# Patient Record
Sex: Female | Born: 1985 | Race: Black or African American | Hispanic: No | Marital: Single | Smoking: Current some day smoker
Health system: Southern US, Community
[De-identification: ages and names within clinical notes are randomized; demographics above are authoritative.]

## PROBLEM LIST (undated history)

## (undated) DIAGNOSIS — D649 Anemia, unspecified: Secondary | ICD-10-CM

## (undated) HISTORY — DX: Anemia, unspecified: D64.9

## (undated) HISTORY — PX: WISDOM TOOTH EXTRACTION: SHX21

---

## 2009-06-13 ENCOUNTER — Ambulatory Visit: Payer: Self-pay | Admitting: Certified Nurse Midwife

## 2009-10-13 ENCOUNTER — Inpatient Hospital Stay: Payer: Self-pay | Admitting: Obstetrics and Gynecology

## 2010-10-20 IMAGING — US US OB US >=[ID] SNGL FETUS
1 series · 17 of 28 positions shown · non-contrast
Comparison: none

REASON FOR EXAM: anatomy dates placenta location
COMMENTS:

[Series 1: us ob us >=(id) sngl fetus · 17 of 57 slices shown]
[im 1/57]
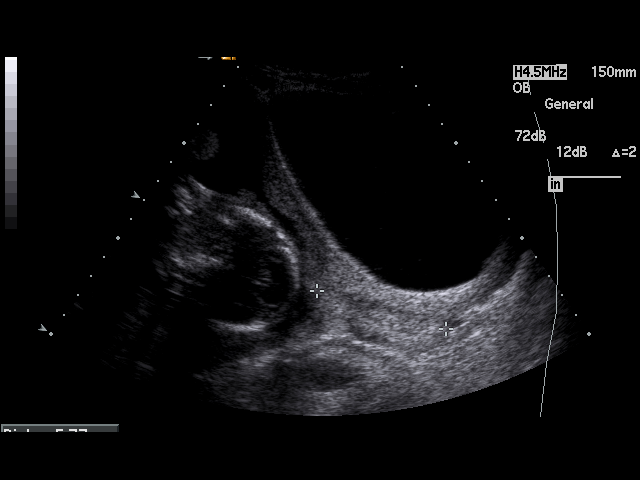
[im 5/57]
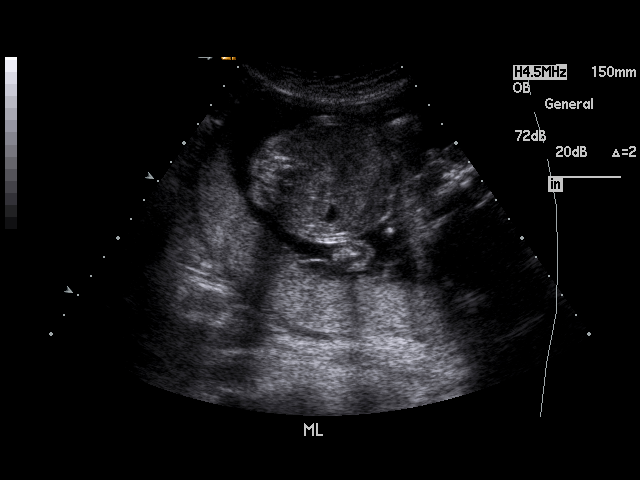
[im 9/57]
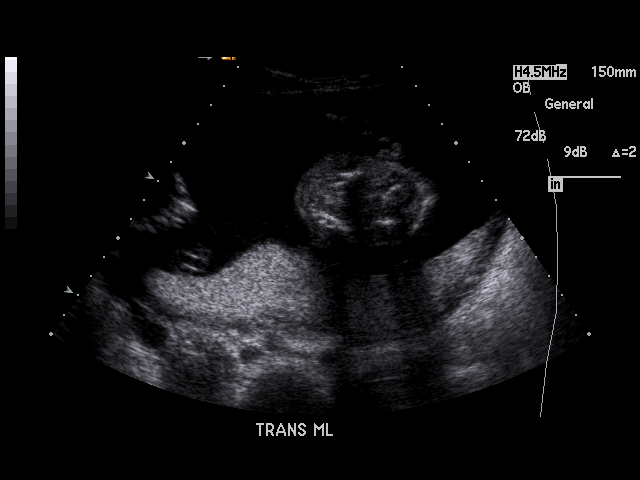
[im 11/57]
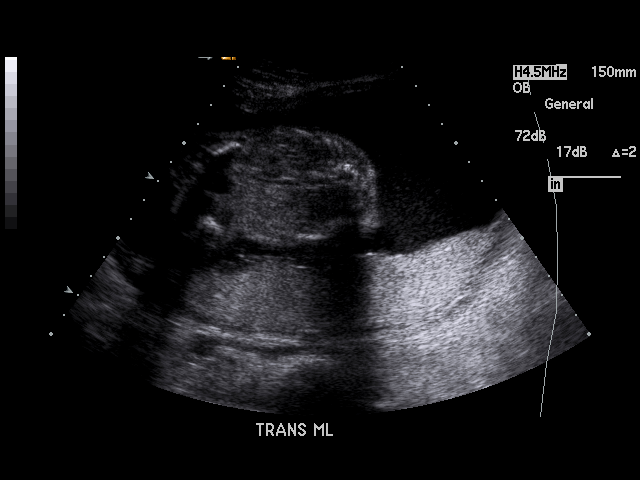
[im 15/57]
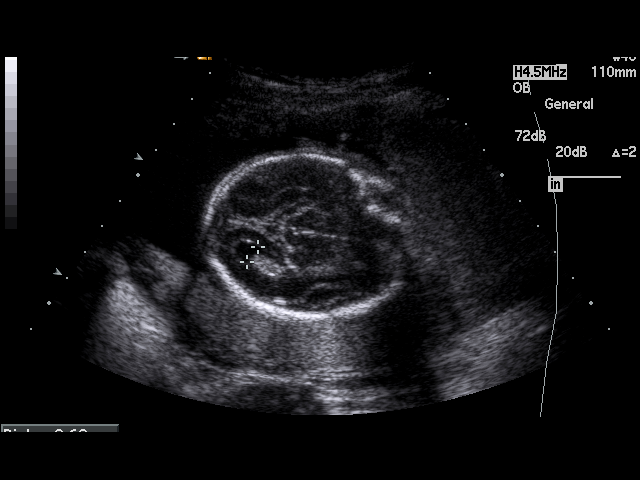
[im 19/57]
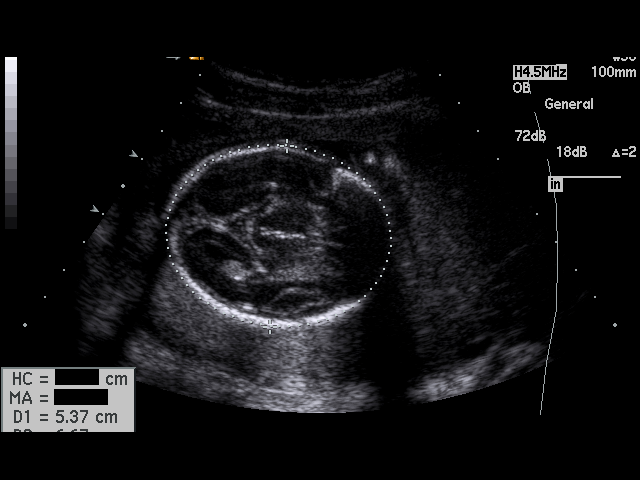
[im 21/57]
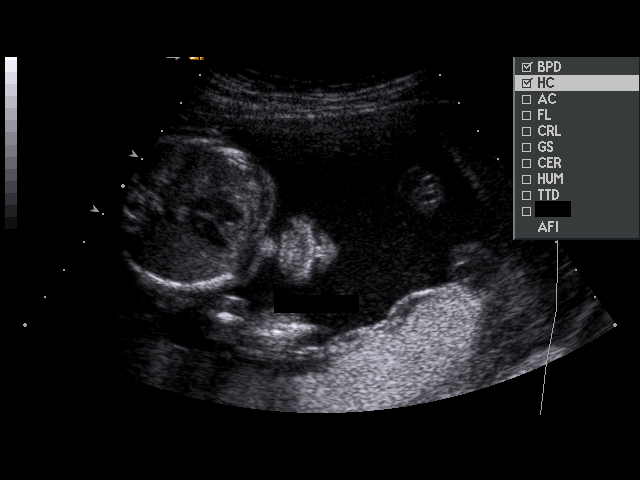
[im 25/57]
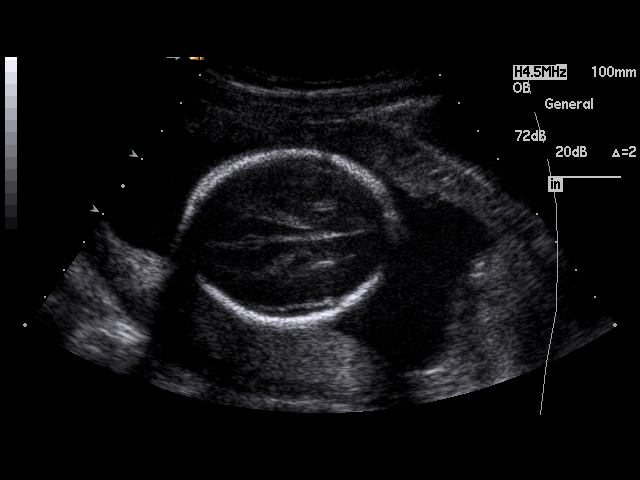
[im 30/57]
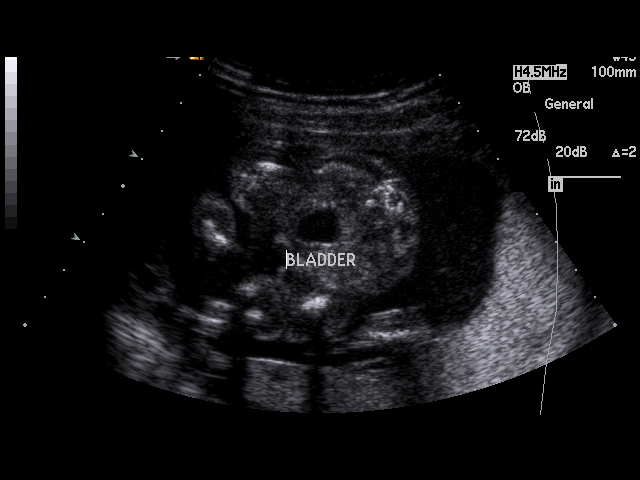
[im 32/57]
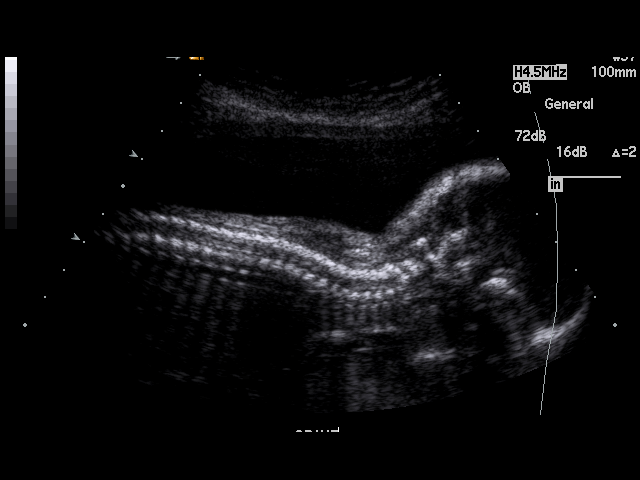
[im 36/57]
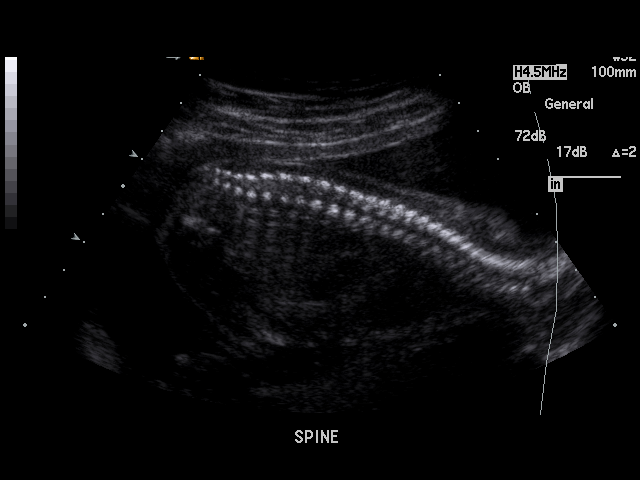
[im 38/57]
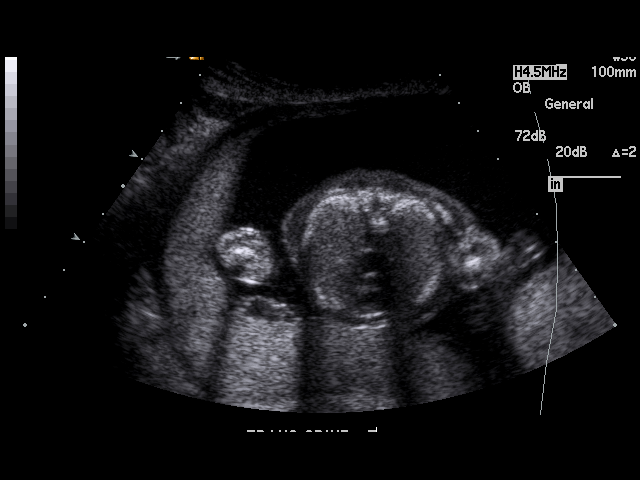
[im 42/57]
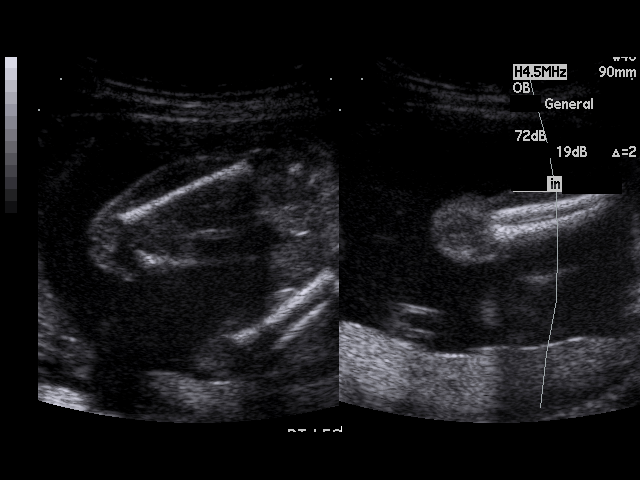
[im 46/57]
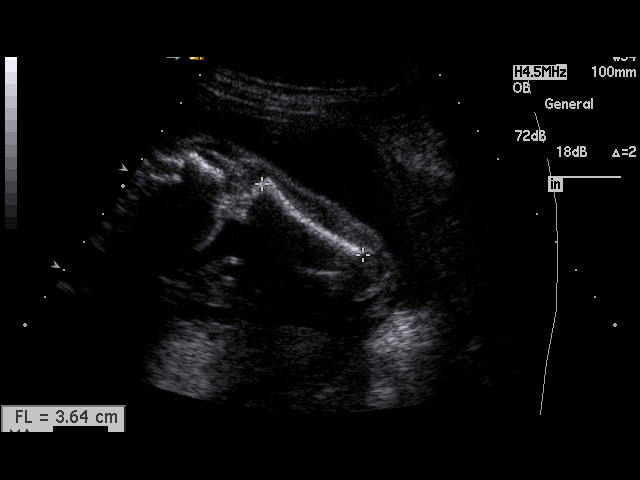
[im 48/57]
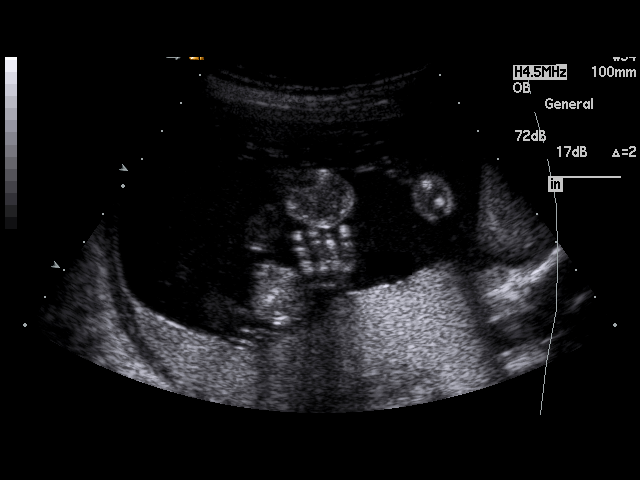
[im 52/57]
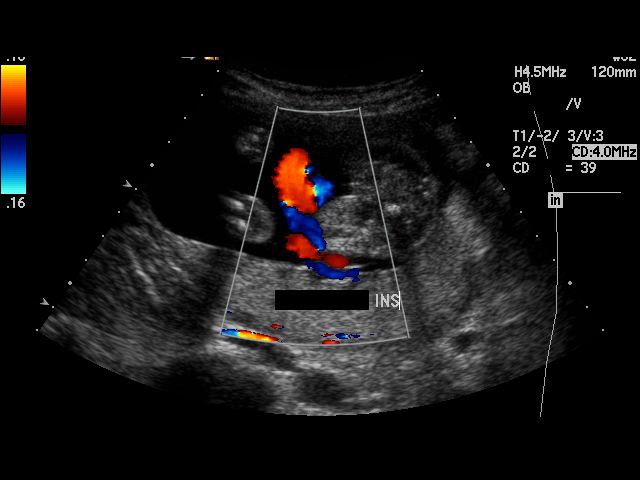
[im 57/57]
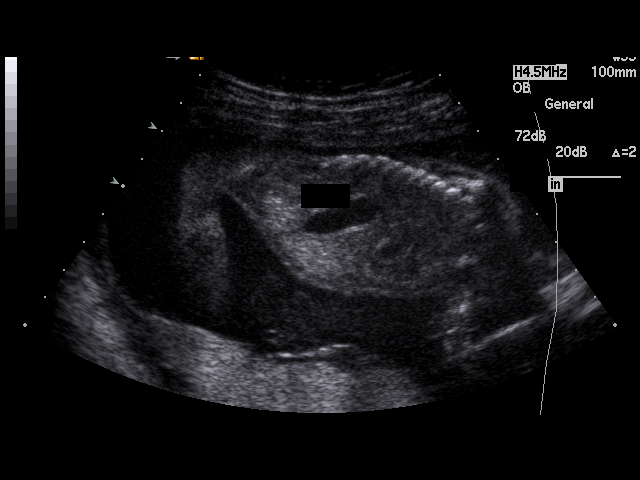

[17 of 28 positions shown; findings below may reference images not displayed]

PROCEDURE:     US  - US OB GREATER/OR EQUAL TO ODIAF  - June 13, 2009  [DATE]

RESULT:     There is observed a single, living intrauterine gestation.
Presentation currently is cephalic. Fetal heart rate was monitored at 141
beats per minute. Amniotic fluid volume appears normal. The placenta is
posterior and terminates approximately 3 cm above the cervix. The cervix
length measures 5.77 cm. The fetal heart, stomach and urinary bladder are
visualized. No hydrocephalus or hydronephrosis is seen. No fetal
abnormalities are noted.

Fetal measurements are as follows:

          BPD is 5.04 cm, corresponding to 21 weeks, 2 days.
            HC is 19.06 cm, corresponding to 21 weeks, 2 days.
             AC is 16.93 cm, corresponding to 21 weeks, 6 days.
              FL is 3.62 cm, corresponding to 21 weeks, 4 days.

EFW is 467 grams + / - 63 grams. Average ultrasound age is 21 weeks, 4 days.
Ultrasound EDD is October 20, 2009.
IMPRESSION: Please see above.

## 2013-11-15 ENCOUNTER — Emergency Department: Payer: Self-pay | Admitting: Emergency Medicine

## 2014-03-25 ENCOUNTER — Observation Stay: Payer: Self-pay

## 2014-03-25 LAB — URINALYSIS, COMPLETE
BACTERIA: NONE SEEN
Bilirubin,UR: NEGATIVE
Blood: NEGATIVE
Glucose,UR: NEGATIVE mg/dL (ref 0–75)
LEUKOCYTE ESTERASE: NEGATIVE
NITRITE: NEGATIVE
PROTEIN: NEGATIVE
Ph: 5 (ref 4.5–8.0)
RBC,UR: 3 /HPF (ref 0–5)
Specific Gravity: 1.028 (ref 1.003–1.030)
Squamous Epithelial: 10
WBC UR: 2 /HPF (ref 0–5)

## 2014-05-09 ENCOUNTER — Observation Stay: Payer: Self-pay | Admitting: Obstetrics and Gynecology

## 2014-05-09 LAB — URINALYSIS, COMPLETE
Bacteria: NONE SEEN
Bilirubin,UR: NEGATIVE
Blood: NEGATIVE
Glucose,UR: NEGATIVE mg/dL (ref 0–75)
Ketone: NEGATIVE
Leukocyte Esterase: NEGATIVE
Nitrite: NEGATIVE
Ph: 6 (ref 4.5–8.0)
Protein: NEGATIVE
RBC,UR: 2 /HPF (ref 0–5)
Specific Gravity: 1.016 (ref 1.003–1.030)
Squamous Epithelial: 7
WBC UR: 4 /HPF (ref 0–5)

## 2014-06-09 ENCOUNTER — Observation Stay: Payer: Self-pay

## 2014-06-18 ENCOUNTER — Observation Stay: Payer: Self-pay | Admitting: Obstetrics and Gynecology

## 2014-06-24 ENCOUNTER — Observation Stay: Payer: Self-pay | Admitting: Obstetrics and Gynecology

## 2014-07-03 ENCOUNTER — Observation Stay: Payer: Self-pay | Admitting: Obstetrics and Gynecology

## 2014-07-04 ENCOUNTER — Inpatient Hospital Stay: Payer: Self-pay | Admitting: Obstetrics and Gynecology

## 2014-07-04 LAB — CBC WITH DIFFERENTIAL/PLATELET
Basophil #: 0.1 10*3/uL (ref 0.0–0.1)
Basophil %: 0.7 %
EOS ABS: 0.1 10*3/uL (ref 0.0–0.7)
Eosinophil %: 1.5 %
HCT: 36.9 % (ref 35.0–47.0)
HGB: 12.2 g/dL (ref 12.0–16.0)
Lymphocyte #: 1.6 10*3/uL (ref 1.0–3.6)
Lymphocyte %: 16.3 %
MCH: 33.9 pg (ref 26.0–34.0)
MCHC: 33.1 g/dL (ref 32.0–36.0)
MCV: 102 fL — AB (ref 80–100)
Monocyte #: 0.9 x10 3/mm (ref 0.2–0.9)
Monocyte %: 9 %
NEUTROS ABS: 7.1 10*3/uL — AB (ref 1.4–6.5)
NEUTROS PCT: 72.5 %
Platelet: 208 10*3/uL (ref 150–440)
RBC: 3.61 10*6/uL — ABNORMAL LOW (ref 3.80–5.20)
RDW: 13.3 % (ref 11.5–14.5)
WBC: 9.8 10*3/uL (ref 3.6–11.0)

## 2014-07-05 LAB — HEMATOCRIT: HCT: 37.4 % (ref 35.0–47.0)

## 2015-02-19 NOTE — H&P (Signed)
   Subjective/Chief Complaint Increased discharge this AM   History of Present Illness 29 yo G4P2012 @ 40.0 by 20+3 week US presenting with contractions and possible leakage of fluid. She endorses painful contractions every 5-7 minutes. She reports increased discharge this AM, but denies frank leakage of fluid. She reports good fetal movement and no vaginal bleeding. She is otherwise well, but tired, sore, and uncomfortable in her pregnant state.   Past History PMH: Cervical dysplasia PSH: LEEP ALL: Denies POB: 2 uncomplicated vaginal deliveries. Largest baby 7#14, estimates this baby to be larger   Primary Physician Dana Mccall   Code Status Full Code   ALLERGIES:  No Known Allergies:   HOME MEDICATIONS: Medication Instructions Status  Prenatal Multivitamins oral tablet 1 tab(s)  once a day  Active   Family and Social History:  Family History Non-Contributory   Social History negative tobacco, negative ETOH, negative Illicit drugs   Place of Living Home   Review of Systems:  Abdominal Pain Yes  Contractions   Physical Exam:  GEN well developed, well nourished, no acute distress   HEENT PERRL, moist oral mucosa   RESP normal resp effort   CARD regular rate   ABD denies tenderness  denies Flank Tenderness   EXTR negative cyanosis/clubbing, negative edema   Additional Comments EFM: 130 mod var + A no D TOCO: Q7-12 minutes    Assessment/Admission Diagnosis 29 yo [id number] @40 .0 by 20.3 US with r/o labor, r/o ROM.   Plan 1. R/O labor: SVE is 3/75/-3 VTX. Essentially unchanged from office exam 8/31. She was offered the opportunity to walk for 1 hour. and had no additional change in her cervix ---Pt is adamant that she desires elective induction of labor, but I counseled her that this is not the best option for her health or her baby's citing the increased risk of cesarean section, unnecessary augmentation.  ---Adequate transportation and close proximity to the hospital  assured.  2. FWB: Reassuring Cat 1 tracing. 130 mod var + A no D 3. Dispo: D/C home, stable, follow-up this week for scheduled NST x 2. PDIOL in 1 week to be planned.   Electronic Signatures: Dana Mccall, Dana Mccall (MD)  (Signed 05-Sep-15 09:39)  Authored: CHIEF COMPLAINT and HISTORY, ALLERGIES, HOME MEDICATIONS, FAMILY AND SOCIAL HISTORY, REVIEW OF SYSTEMS, PHYSICAL EXAM, ASSESSMENT AND PLAN   Last Updated: 05-Sep-15 09:39 by Dana Mccall, Deshawna Mcneece Mccall (MD)

## 2015-03-08 NOTE — H&P (Signed)
L&D Evaluation:  History Expanded:  HPI 29 yo [id number] with LMP of 10/04/13 & EDD by US of 07/03/14 here for SPONTANEOUS RUPTURe of membranes since 0700 and has felt some ctxs. She is here for delivery.   Gravida 4   Term 2   PreTerm 0   Abortion 1   Living 2   Blood Type (Maternal) O positive   Group B Strep Results Maternal (Result >5wks must be treated as unknown) positive   Maternal HIV Negative   Maternal Varicella Immune   Rubella Results (Maternal) immune   Maternal T-Dap Immune   EDC 03-Jul-2014   Presents with contractions, leaking fluid, pelvic pain   Patient's Medical History Anemia, Abnormal Pap, HGSIL hx   Patient's Surgical History LEEP   Medications Pre Natal Vitamins   Allergies NKDA   Social History none   Family History Non-Contributory   ROS:  ROS All systems were reviewed.  HEENT, CNS, GI, GU, Respiratory, CV, Renal and Musculoskeletal systems were found to be normal.   Exam:  Vital Signs stable  117/70   Urine Protein not completed   General no apparent distress, except some contractions   Mental Status clear   Chest clear   Heart normal sinus rhythm, no murmur/gallop/rubs   Abdomen gravid, non-tender   Estimated Fetal Weight Average for gestational age   Fetal Position vtx   Fundal Height term   Back no CVAT   Edema no edema   Pelvic 3/50-tight inner band   Mebranes Intact   Description clear   FHT normal rate with no decels, Cat I, REactive NST   Ucx irregular   Skin dry   Lymph no lymphadenopathy   Impression:  Impression IUP AT 40 WEEKS3 days   Plan:  Comments admit to l and d for delivery, amp for GBS.   Follow Up Appointment need to schedule. in 6 weeks   Electronic Signatures: Adria DevonKlett, Baylin Gamblin (MD)  (Signed 06-Sep-15 23:39)  Authored: L&D Evaluation   Last Updated: 06-Sep-15 23:39 by Adria DevonKlett, Sheanna Dail (MD)

## 2015-03-08 NOTE — H&P (Signed)
L&D Evaluation:  History:  HPI 29 yo G4P2010 with LMP of 10/04/13 & EDD by US of 07/03/14 here for " lower pelvic pain" since this am at 0900 and has felt some B-H's. Pt is concerned after going to work all day and the discomfort is still present. No VB, decreased FM, ROM or any other problems. Pt c/o "numbness of Rt hand".   Presents with pelvic pain   Patient's Medical History Anemia, Abnormal Pap, HGSIL hx   Patient's Surgical History LEEP   Medications Pre Natal Vitamins   Allergies NKDA   Social History none   Family History Non-Contributory   ROS:  ROS All systems were reviewed.  HEENT, CNS, GI, GU, Respiratory, CV, Renal and Musculoskeletal systems were found to be normal.   Exam:  Vital Signs stable  117/70   Mental Status clear   Chest clear   Heart normal sinus rhythm, no murmur/gallop/rubs   Abdomen gravid, non-tender   Estimated Fetal Weight Average for gestational age   Fetal Position ?   Back no CVAT   Pelvic cervix closed and thick   Mebranes Intact   FHT normal rate with no decels, Cat I, REactive NST   Ucx rare   Skin dry   Lymph no lymphadenopathy   Impression:  Impression IUP AT 25 WEEKS W/b-h'S   Plan:  Plan DC home   Comments REst, pelvic and bedrest x 1 day. Out of work 03/26/14. FU at Tues.   Electronic Signatures: Sharee PimpleJones, Caron W (CNM)  (Signed 28-May-15 22:56)  Authored: L&D Evaluation   Last Updated: 28-May-15 22:56 by Sharee PimpleJones, Caron W (CNM)

## 2016-04-24 ENCOUNTER — Emergency Department
Admission: EM | Admit: 2016-04-24 | Discharge: 2016-04-24 | Disposition: A | Discharge status: Home / Self Care | Payer: Medicaid Other | Attending: Emergency Medicine | Admitting: Emergency Medicine

## 2016-04-24 DIAGNOSIS — K529 Noninfective gastroenteritis and colitis, unspecified: Secondary | ICD-10-CM | POA: Insufficient documentation

## 2016-04-24 DIAGNOSIS — R112 Nausea with vomiting, unspecified: Secondary | ICD-10-CM | POA: Diagnosis present

## 2016-04-24 LAB — URINALYSIS COMPLETE WITH MICROSCOPIC (ARMC ONLY)
Bilirubin Urine: NEGATIVE
GLUCOSE, UA: NEGATIVE mg/dL
Ketones, ur: NEGATIVE mg/dL
Leukocytes, UA: NEGATIVE
Nitrite: NEGATIVE
Protein, ur: NEGATIVE mg/dL
SPECIFIC GRAVITY, URINE: 1.023 (ref 1.005–1.030)
pH: 5 (ref 5.0–8.0)

## 2016-04-24 LAB — COMPREHENSIVE METABOLIC PANEL
ALBUMIN: 4.5 g/dL (ref 3.5–5.0)
ALT: 13 U/L — AB (ref 14–54)
AST: 18 U/L (ref 15–41)
Alkaline Phosphatase: 43 U/L (ref 38–126)
Anion gap: 6 (ref 5–15)
BUN: 24 mg/dL — AB (ref 6–20)
CHLORIDE: 108 mmol/L (ref 101–111)
CO2: 24 mmol/L (ref 22–32)
CREATININE: 0.72 mg/dL (ref 0.44–1.00)
Calcium: 8.9 mg/dL (ref 8.9–10.3)
GFR calc Af Amer: >60 mL/min (ref >60)
GFR calc non Af Amer: >60 mL/min (ref >60)
GLUCOSE: 114 mg/dL — AB (ref 65–99)
Potassium: 3.7 mmol/L (ref 3.5–5.1)
SODIUM: 138 mmol/L (ref 135–145)
Total Bilirubin: 0.4 mg/dL (ref 0.3–1.2)
Total Protein: 7.5 g/dL (ref 6.5–8.1)

## 2016-04-24 LAB — CBC WITH DIFFERENTIAL/PLATELET
BASOS PCT: 1 %
Basophils Absolute: 0 10*3/uL (ref 0–0.1)
EOS ABS: 0.2 10*3/uL (ref 0–0.7)
EOS PCT: 2 %
HCT: 35 % (ref 35.0–47.0)
HEMOGLOBIN: 12.4 g/dL (ref 12.0–16.0)
Lymphocytes Relative: 17 %
Lymphs Abs: 1.3 10*3/uL (ref 1.0–3.6)
MCH: 34.8 pg — ABNORMAL HIGH (ref 26.0–34.0)
MCHC: 35.4 g/dL (ref 32.0–36.0)
MCV: 98.2 fL (ref 80.0–100.0)
MONOS PCT: 6 %
Monocytes Absolute: 0.5 10*3/uL (ref 0.2–0.9)
NEUTROS PCT: 74 %
Neutro Abs: 6 10*3/uL (ref 1.4–6.5)
PLATELETS: 244 10*3/uL (ref 150–440)
RBC: 3.56 MIL/uL — ABNORMAL LOW (ref 3.80–5.20)
RDW: 11.8 % (ref 11.5–14.5)
WBC: 8 10*3/uL (ref 3.6–11.0)

## 2016-04-24 LAB — POCT PREGNANCY, URINE: PREG TEST UR: NEGATIVE

## 2016-04-24 LAB — LIPASE, BLOOD: Lipase: 21 U/L (ref 11–51)

## 2016-04-24 MED ORDER — SODIUM CHLORIDE 0.9 % IV SOLN
1000.0000 mL | Freq: Once | INTRAVENOUS | Status: AC
  Administered 2016-04-24: 1000 mL via INTRAVENOUS

## 2016-04-24 MED ORDER — ONDANSETRON HCL 4 MG/2ML IJ SOLN
4.0000 mg | Freq: Once | INTRAMUSCULAR | Status: AC
  Administered 2016-04-24: 4 mg via INTRAVENOUS
  Filled 2016-04-24: qty 2

## 2016-04-24 MED ORDER — MORPHINE SULFATE (PF) 4 MG/ML IV SOLN
4.0000 mg | Freq: Once | INTRAVENOUS | Status: AC
  Administered 2016-04-24: 4 mg via INTRAVENOUS
  Filled 2016-04-24: qty 1

## 2016-04-24 MED ORDER — DICYCLOMINE HCL 20 MG PO TABS: 20.0000 mg | ORAL_TABLET | Freq: Three times a day (TID) | ORAL | Status: DC | PRN

## 2016-04-24 MED ORDER — ONDANSETRON HCL 4 MG PO TABS: 4.0000 mg | ORAL_TABLET | Freq: Every day | ORAL | Status: DC | PRN

## 2016-04-24 NOTE — Discharge Instructions (Signed)
Norovirus Infection °A norovirus infection is caused by exposure to a virus in a group of similar viruses (noroviruses). This type of infection causes inflammation in your stomach and intestines (gastroenteritis). Norovirus is the most common cause of gastroenteritis. It also causes food poisoning. °Anyone can get a norovirus infection. It spreads very easily (contagious). You can get it from contaminated food, water, surfaces, or other people. Norovirus is found in the stool or vomit of infected people. You can spread the infection as soon as you feel sick until 2 weeks after you recover.  °Symptoms usually begin within 2 days after you become infected. Most norovirus symptoms affect the digestive system. °CAUSES °Norovirus infection is caused by contact with norovirus. You can catch norovirus if you: °· Eat or drink something contaminated with norovirus. °· Touch surfaces or objects contaminated with norovirus and then put your hand in your mouth. °· Have direct contact with an infected person who has symptoms. °· Share food, drink, or utensils with someone with who is sick with norovirus. °SIGNS AND SYMPTOMS °Symptoms of norovirus may include: °· Nausea. °· Vomiting. °· Diarrhea. °· Stomach cramps. °· Fever. °· Chills. °· Headache. °· Muscle aches. °· Tiredness. °DIAGNOSIS °Your health care provider may suspect norovirus based on your symptoms and physical exam. Your health care provider may also test a sample of your stool or vomit for the virus.  °TREATMENT °There is no specific treatment for norovirus. Most people get better without treatment in about 2 days. °HOME CARE INSTRUCTIONS °· Replace lost fluids by drinking plenty of water or rehydration fluids containing important minerals called electrolytes. This prevents dehydration. Drink enough fluid to keep your urine clear or pale yellow. °· Do not prepare food for others while you are infected. Wait at least 3 days after recovering from the illness to do  that. °PREVENTION  °· Wash your hands often, especially after using the toilet or changing a diaper. °· Wash fruits and vegetables thoroughly before preparing or serving them. °· Throw out any food that a sick person may have touched. °· Disinfect contaminated surfaces immediately after someone in the household has been sick. Use a bleach-based household cleaner. °· Immediately remove and wash soiled clothes or sheets. °SEEK MEDICAL CARE IF: °· Your vomiting, diarrhea, and stomach pain is getting worse. °· Your symptoms of norovirus do not go away after 2-3 days. °SEEK IMMEDIATE MEDICAL CARE IF:  °You develop symptoms of dehydration that do not improve with fluid replacement. This may include: °· Excessive sleepiness. °· Lack of tears. °· Dry mouth. °· Dizziness when standing. °· Weak pulse. °  °This information is not intended to replace advice given to you by your health care provider. Make sure you discuss any questions you have with your health care provider. °  °Document Released: 01/05/2003 Document Revised: 11/05/2014 Document Reviewed: 03/25/2014 °Elsevier Interactive Patient Education ©2016 [employer] ° °

## 2016-04-24 NOTE — ED Notes (Signed)
Pt in with co mid abd pain since tonight, also co n.v.d.

## 2016-04-24 NOTE — ED Provider Notes (Signed)
[facility] Emergency Department Provider Note        Time seen: ----------------------------------------- 6:58 AM on 04/24/2016 -----------------------------------------    I have reviewed the triage vital signs and the nursing notes.   HISTORY  Chief Complaint Abdominal Pain    HPI Dana Mccall is a 30 y.o. female who presents to ER with abdominal pain associated with nausea, vomiting and diarrhea since last night.Patient states it woke her up acutely at 3 AM. She has not had any sick contacts, denies the symptoms in the past. Complaining of diffuse mid abdominal cramping. She denies fevers chills or other complaints.   No past medical history on file.  There are no active problems to display for this patient.   No past surgical history on file.  Allergies Review of patient's allergies indicates no known allergies.  Social History Social History  Substance Use Topics  . Smoking status: Not on file  . Smokeless tobacco: Not on file  . Alcohol Use: Not on file    Review of Systems Constitutional: Negative for fever. Cardiovascular: Negative for chest pain. Respiratory: Negative for shortness of breath. Gastrointestinal: Positive for abdominal pain, vomiting and diarrhea Genitourinary: Negative for dysuria. Musculoskeletal: Negative for back pain. Skin: Negative for rash. Neurological: Negative for headaches, focal weakness or numbness.  10-point ROS otherwise negative.  ____________________________________________   PHYSICAL EXAM:  VITAL SIGNS: ED Triage Vitals  Enc Vitals Group     BP 04/24/16 0655 114/69 mmHg     Pulse Rate 04/24/16 0654 65     Resp 04/24/16 0654 18     Temp 04/24/16 0654 97.8 F (36.6 C)     Temp Source 04/24/16 0654 Oral     SpO2 04/24/16 0654 100 %     Weight 04/24/16 0654 140 lb (63.504 kg)     Height 04/24/16 0654 5' (1.524 m)     Head Cir --      Peak Flow --      Pain Score --      Pain Loc --       Pain Edu? --      Excl. in GC? --     Constitutional: Alert and oriented. Well appearing and in no distress. Eyes: Conjunctivae are normal. PERRL. Normal extraocular movements. ENT   Head: Normocephalic and atraumatic.   Nose: No congestion/rhinnorhea.   Mouth/Throat: Mucous membranes are moist.   Neck: No stridor. Cardiovascular: Normal rate, regular rhythm. No murmurs, rubs, or gallops. Respiratory: Normal respiratory effort without tachypnea nor retractions. Breath sounds are clear and equal bilaterally. No wheezes/rales/rhonchi. Gastrointestinal: Soft and nontender. Normal bowel sounds Musculoskeletal: Nontender with normal range of motion in all extremities. No lower extremity tenderness nor edema. Neurologic:  Normal speech and language. No gross focal neurologic deficits are appreciated.  Skin:  Skin is warm, dry and intact. No rash noted. Psychiatric: Mood and affect are normal. Speech and behavior are normal.  ____________________________________________  ED COURSE:  Pertinent labs & imaging results that were available during my care of the patient were reviewed by me and considered in my medical decision making (see chart for details). Patient is in no acute distress, she'll receive IV fluids, antiemetics and pain medicine. ____________________________________________    LABS (pertinent positives/negatives)  Labs Reviewed  CBC WITH DIFFERENTIAL/PLATELET - Abnormal; Notable for the following:    RBC 3.56 (*)    MCH 34.8 (*)    All other components within normal limits  COMPREHENSIVE METABOLIC PANEL - Abnormal; Notable for  the following:    Glucose, Bld 114 (*)    BUN 24 (*)    ALT 13 (*)    All other components within normal limits  URINALYSIS COMPLETEWITH MICROSCOPIC ([facility] ONLY) - Abnormal; Notable for the following:    Color, Urine YELLOW (*)    APPearance HAZY (*)    Hgb urine dipstick 3+ (*)    Bacteria, UA RARE (*)    Squamous Epithelial /  LPF 0-5 (*)    All other components within normal limits  LIPASE, BLOOD  POC URINE PREG, ED  POCT PREGNANCY, URINE   ____________________________________________  FINAL ASSESSMENT AND PLAN  Gastroenteritis  Plan: Patient with labs and imaging as dictated above. Patient received saline bolus, morphine and Zofran. Currently she is feeling better, has no further symptoms. She'll be discharged antiemetics and is encouraged to have close follow-up with her doctor.   Emily FilbertWilliams, Jonathan E, MD   Note: This dictation was prepared with Dragon dictation. Any transcriptional errors that result from this process are unintentional   Emily FilbertJonathan E Williams, MD 04/24/16 [phone]

## 2016-04-24 NOTE — ED Notes (Signed)
assisted pt to bathroom, family at bedside

## 2016-04-24 NOTE — ED Notes (Signed)
Pt given sprite, po challenge

## 2017-03-17 ENCOUNTER — Emergency Department
Admission: EM | Admit: 2017-03-17 | Discharge: 2017-03-17 | Disposition: A | Discharge status: Home / Self Care | Payer: Medicaid Other | Attending: Emergency Medicine | Admitting: Emergency Medicine

## 2017-03-17 DIAGNOSIS — L02412 Cutaneous abscess of left axilla: Secondary | ICD-10-CM | POA: Insufficient documentation

## 2017-03-17 MED ORDER — PENTAFLUOROPROP-TETRAFLUOROETH EX AERO
INHALATION_SPRAY | CUTANEOUS | Status: DC | PRN
  Filled 2017-03-17: qty 30

## 2017-03-17 MED ORDER — SULFAMETHOXAZOLE-TRIMETHOPRIM 800-160 MG PO TABS: 1.0000 | ORAL_TABLET | Freq: Two times a day (BID) | ORAL | 0 refills | Status: DC

## 2017-03-17 MED ORDER — LIDOCAINE HCL (PF) 1 % IJ SOLN
5.0000 mL | Freq: Once | INTRAMUSCULAR | Status: AC
  Administered 2017-03-17: 5 mL via INTRADERMAL
  Filled 2017-03-17: qty 5

## 2017-03-17 NOTE — ED Notes (Signed)
See triage note  States she developed a possible abscess area under left arm about 1-2 weeks ago  States she was able to get some drainage from area   Now still having some swelling and tenderness under arm

## 2017-03-17 NOTE — Discharge Instructions (Signed)
Remove the packing in 2 days if you are feeling better and the drainage has stopped. If you are not better and the drainage continues, do not pull out the packing and return to the ER.

## 2017-03-17 NOTE — ED Triage Notes (Signed)
Pt came to ED via pov c/o abscess under left arm. Denies fevers, reports has been using hot compresses. Reports has been seen last week to have drained but nothing came out. Has been using ointment.

## 2017-03-17 NOTE — ED Provider Notes (Signed)
[facility] Emergency Department Provider Note  ____________________________________________  Time seen: Approximately 1:26 PM  I have reviewed the triage vital signs and the nursing notes.   HISTORY  Chief Complaint Abscess   HPI Dana Mccall is a 31 y.o. female who presents to the emergency department for evaluation of an abscess under her left underarm. She was evaluated by her primary care provider a few days ago. She states that the primary care provider attempted to drain it by "sticking a needle in it." She did not have any return of purulent drainage and states that the tenderness and swelling has increased since that time. She denies fever. She states that she does have a history of abscess, but has never had to have any I&D procedures. She does not smoke and does not diabetic. She states that abscesses do run in her family and they have had multiple surgical procedures.  History reviewed. No pertinent past medical history.  There are no active problems to display for this patient.   History reviewed. No pertinent surgical history.  Prior to Admission medications   Medication Sig Start Date End Date Taking? Authorizing Provider  dicyclomine (BENTYL) 20 MG tablet Take 1 tablet (20 mg total) by mouth 3 (three) times daily as needed for spasms. 04/24/16 04/24/17  Emily Filbert, MD  ondansetron (ZOFRAN) 4 MG tablet Take 1 tablet (4 mg total) by mouth daily as needed for nausea or vomiting. 04/24/16   Emily Filbert, MD  sulfamethoxazole-trimethoprim (BACTRIM DS,SEPTRA DS) 800-160 MG tablet Take 1 tablet by mouth 2 (two) times daily. 03/17/17   Chinita Pester, FNP    Allergies Patient has no known allergies.  No family history on file.  Social History Social History  Substance Use Topics  . Smoking status: Never Smoker  . Smokeless tobacco: Not on file  . Alcohol use Yes     Comment: ocassionally    Review of  Systems  Constitutional: Negative for fever.  Respiratory: Negative for cough or shortness of breath  Musculoskeletal: Negative for musculoskeletal pain or body aches  Skin: Positive for abscess Neurological: Negative for loss of sensation or paresthesias. ____________________________________________   PHYSICAL EXAM:  VITAL SIGNS: ED Triage Vitals [03/17/17 1227]  Enc Vitals Group     BP 116/62     Pulse Rate (!) 58     Resp 16     Temp 98.9 F (37.2 C)     Temp Source Oral     SpO2 100 %     Weight 143 lb (64.9 kg)     Height 5' (1.524 m)     Head Circumference      Peak Flow      Pain Score      Pain Loc      Pain Edu?      Excl. in GC?      Constitutional: Well appearing. Eyes: Conjunctivae are clear without drainage or discharge. Nose: No rhinorrhea noted. Neck: Full, active range of motion without pain. Lymphatic: No lymphadenopathy noted.  Respiratory: Respirations are even and unlabored.. Musculoskeletal: Full range of motion of the left shoulder and arm. Neurologic: No decrease in motor or sensation Skin:  Fluctuant and mildly indurated area noted under the left axilla  ____________________________________________   LABS (all labs ordered are listed, but only abnormal results are displayed)  Labs Reviewed - No data to display ____________________________________________  EKG   ____________________________________________  RADIOLOGY  Not indicated ____________________________________________   PROCEDURES  Procedure(s)  performed:  INCISION AND DRAINAGE Performed by: Kem Boroughsari Antanisha Mohs Consent: Verbal consent obtained. Risks and benefits: risks, benefits and alternatives were discussed Type: abscess  Body area: Left axilla  Anesthesia: local infiltration  Incision was made with a scalpel.  Local anesthetic: lidocaine 1 % without epinephrine  Anesthetic total: 5 ml  Complexity: complex  Blunt dissection to break up  loculations  Drainage: purulent  Drainage amount: Large   Packing material: 1/4 in iodoform gauze  Patient tolerance: Patient tolerated the procedure well with no immediate complications.    ____________________________________________   INITIAL IMPRESSION / ASSESSMENT AND PLAN / ED COURSE  Dana Mccall is a 31 y.o. female who presents to the emergency department for evaluation and treatment of an abscess under her left arm. Incision and drainage was performed while in the emergency department with successful return of purulent drainage. Packing was inserted and the patient was advised to remove it in 2 days if she is feeling better and has not had any for additional drainage. She was advised to leave the packing in place and return to the ER if drainage continues or pain has not improved. She will be started on Bactrim. She states that she will take ibuprofen or Tylenol as needed for pain.   Pertinent labs & imaging results that were available during my care of the patient were reviewed by me and considered in my medical decision making (see chart for details). ____________________________________________   FINAL CLINICAL IMPRESSION(S) / ED DIAGNOSES  Final diagnoses:  Abscess of left axilla    New Prescriptions   SULFAMETHOXAZOLE-TRIMETHOPRIM (BACTRIM DS,SEPTRA DS) 800-160 MG TABLET    Take 1 tablet by mouth 2 (two) times daily.    If controlled substance prescribed during this visit, 12 month history viewed on the [facility] prior to issuing an initial prescription for Schedule II or III opiod.   Note:  This document was prepared using Dragon voice recognition software and may include unintentional dictation errors.    Chinita Pesterriplett, Lacorey Brusca B, FNP 03/17/17 1401    Jeanmarie PlantMcShane, James A, MD 03/17/17 [phone]

## 2018-11-14 ENCOUNTER — Ambulatory Visit: Payer: BLUE CROSS/BLUE SHIELD | Admitting: Podiatry

## 2019-07-25 ENCOUNTER — Emergency Department
Admission: EM | Admit: 2019-07-25 | Discharge: 2019-07-25 | Disposition: A | Discharge status: Home / Self Care | Payer: BC Managed Care – PPO | Attending: Emergency Medicine | Admitting: Emergency Medicine

## 2019-07-25 ENCOUNTER — Other Ambulatory Visit: Payer: Self-pay

## 2019-07-25 ENCOUNTER — Encounter: Payer: Self-pay | Admitting: Emergency Medicine

## 2019-07-25 DIAGNOSIS — L0291 Cutaneous abscess, unspecified: Secondary | ICD-10-CM

## 2019-07-25 DIAGNOSIS — Z79899 Other long term (current) drug therapy: Secondary | ICD-10-CM | POA: Insufficient documentation

## 2019-07-25 DIAGNOSIS — L02214 Cutaneous abscess of groin: Secondary | ICD-10-CM | POA: Diagnosis not present

## 2019-07-25 DIAGNOSIS — F17200 Nicotine dependence, unspecified, uncomplicated: Secondary | ICD-10-CM | POA: Diagnosis not present

## 2019-07-25 MED ORDER — LIDOCAINE HCL (PF) 1 % IJ SOLN
5.0000 mL | Freq: Once | INTRAMUSCULAR | Status: AC
Start: 2019-07-25 — End: 2019-07-25
  Administered 2019-07-25: 5 mL
  Filled 2019-07-25: qty 5

## 2019-07-25 MED ORDER — SULFAMETHOXAZOLE-TRIMETHOPRIM 800-160 MG PO TABS: 1.0000 | ORAL_TABLET | Freq: Two times a day (BID) | ORAL | 0 refills | Status: DC

## 2019-07-25 MED ORDER — HYDROCODONE-ACETAMINOPHEN 5-325 MG PO TABS: 1.0000 | ORAL_TABLET | Freq: Three times a day (TID) | ORAL | 0 refills | Status: AC | PRN

## 2019-07-25 MED ORDER — HYDROCODONE-ACETAMINOPHEN 5-325 MG PO TABS: 1.0000 | ORAL_TABLET | Freq: Once | ORAL | Status: DC

## 2019-07-25 MED ORDER — IBUPROFEN 800 MG PO TABS
800.0000 mg | ORAL_TABLET | Freq: Once | ORAL | Status: AC
  Administered 2019-07-25: 800 mg via ORAL
  Filled 2019-07-25: qty 1

## 2019-07-25 MED ORDER — SULFAMETHOXAZOLE-TRIMETHOPRIM 800-160 MG PO TABS
1.0000 | ORAL_TABLET | Freq: Once | ORAL | Status: AC
  Administered 2019-07-25: 19:00:00 1 via ORAL
  Filled 2019-07-25: qty 1

## 2019-07-25 NOTE — ED Notes (Addendum)
Pt presents with an abscess to left goring since Thursday. Pt st taking ibuprofen at home 400mg  w/o relief. Pain 8/10. Pt st attempted to squeeze abcess but "it has not come to a head and it hurts". Pt denies fevers

## 2019-07-25 NOTE — ED Triage Notes (Signed)
Patient presents to the ED with an abscess to her left groin area.  Patient states area is painful.  Patient reports attempting to use warm compressions to relieve pain with no result.

## 2019-07-25 NOTE — ED Provider Notes (Signed)
[facility] Emergency Department Provider Note ____________________________________________  Time seen: 1823  I have reviewed the triage vital signs and the nursing notes.  HISTORY  Chief Complaint  Abscess  HPI Dana Mccall is a 33 y.o. female presents herself to the ED for evaluation of an abscess to the left groin.  Patient presents with 3 days complaint of fullness and tenderness to the pubis region.  She denies any previous episodes of abscesses requiring I&D procedure.  She denies any interim fevers, chills, sweats.   History reviewed. No pertinent past medical history.  There are no active problems to display for this patient.  History reviewed. No pertinent surgical history.  Prior to Admission medications   Medication Sig Start Date End Date Taking? Authorizing Provider  dicyclomine (BENTYL) 20 MG tablet Take 1 tablet (20 mg total) by mouth 3 (three) times daily as needed for spasms. 04/24/16 04/24/17  Emily Filbert, MD  HYDROcodone-acetaminophen (NORCO) 5-325 MG tablet Take 1 tablet by mouth 3 (three) times daily as needed for up to 3 days. 07/25/19 07/28/19  Aloria Looper, Charlesetta Ivory, PA-C  ondansetron (ZOFRAN) 4 MG tablet Take 1 tablet (4 mg total) by mouth daily as needed for nausea or vomiting. 04/24/16   Emily Filbert, MD  sulfamethoxazole-trimethoprim (BACTRIM DS) 800-160 MG tablet Take 1 tablet by mouth 2 (two) times daily. 07/25/19   Krithik Mapel, Charlesetta Ivory, PA-C    Allergies Patient has no known allergies.  No family history on file.  Social History Social History   Tobacco Use  . Smoking status: Current Some Day Smoker  . Smokeless tobacco: Never Used  Substance Use Topics  . Alcohol use: Yes    Comment: ocassionally  . Drug use: No    Review of Systems  Constitutional: Negative for fever. Cardiovascular: Negative for chest pain. Respiratory: Negative for shortness of breath. Gastrointestinal: Negative for abdominal  pain, vomiting and diarrhea. Genitourinary: Negative for dysuria. Musculoskeletal: Negative for back pain. Skin: Negative for rash.  Groin abscess as above. Neurological: Negative for headaches, focal weakness or numbness. ____________________________________________  PHYSICAL EXAM:  VITAL SIGNS: ED Triage Vitals  Enc Vitals Group     BP 07/25/19 1739 127/77     Pulse Rate 07/25/19 1739 73     Resp 07/25/19 1739 16     Temp 07/25/19 1739 98.7 F (37.1 C)     Temp Source 07/25/19 1739 Oral     SpO2 07/25/19 1739 100 %     Weight 07/25/19 1740 157 lb (71.2 kg)     Height 07/25/19 1740 5' (1.524 m)     Head Circumference --      Peak Flow --      Pain Score 07/25/19 1740 8     Pain Loc --      Pain Edu? --      Excl. in GC? --     Constitutional: Alert and oriented. Well appearing and in no distress. Head: Normocephalic and atraumatic. Eyes: Conjunctivae are normal. PERRL. Normal extraocular movements Hematological/Lymphatic/Immunological: palpable left inguinal lymphadenopathy. Cardiovascular: Normal rate, regular rhythm. Normal distal pulses. Respiratory: Normal respiratory effort. No wheezes/rales/rhonchi. Gastrointestinal: Soft and nontender. No distention. Musculoskeletal: Nontender with normal range of motion in all extremities.  Neurologic:  Normal gait without ataxia. Normal speech and language. No gross focal neurologic deficits are appreciated. Skin:  Skin is warm, dry and intact. No rash noted. Left mons pubis with focal abscess formation and surrounding cellulitis. No spontaneous drainage noted to  pointing, fluctuant center. ____________________________________________  PROCEDURES  Norco 5-325 mg PO Bactrim DS 1 PO  .Marland KitchenIncision and Drainage  Date/Time: 07/25/2019 7:06 PM Performed by: Melvenia Needles, PA-C Authorized by: Melvenia Needles, PA-C   Consent:    Consent obtained:  Verbal   Consent given by:  Patient   Risks discussed:  Bleeding  and pain   Alternatives discussed:  Alternative treatment Location:    Type:  Abscess   Location:  Anogenital   Anogenital location: mons pubis. Pre-procedure details:    Skin preparation:  Betadine Anesthesia (see MAR for exact dosages):    Anesthesia method:  Local infiltration   Local anesthetic:  Lidocaine 1% w/o epi Procedure type:    Complexity:  Simple Procedure details:    Needle aspiration: no     Incision types:  Single straight   Incision depth:  Subcutaneous   Scalpel blade:  11   Wound management:  Probed and deloculated and irrigated with saline   Drainage:  Purulent   Drainage amount:  Moderate   Packing materials:  1/4 in iodoform gauze   Amount 1/4" iodoform:  3 Post-procedure details:    Patient tolerance of procedure:  Tolerated well, no immediate complications   ____________________________________________  INITIAL IMPRESSION / ASSESSMENT AND PLAN / ED COURSE  Patient with ED evaluation and management of a focal abscess to the left groin region.  Patient tolerated procedure well and meaningful I&D procedure was performed to the abscess.  Patient is discharged with wound care instructions as well as antibiotic prescription and pain medicine prescription she will follow-up with the ED in 3 days for interim wound check and packing removal.  Anwar Crill was evaluated in Emergency Department on 07/25/2019 for the symptoms described in the history of present illness. She was evaluated in the context of the global COVID-19 pandemic, which necessitated consideration that the patient might be at risk for infection with the SARS-CoV-2 virus that causes COVID-19. Institutional protocols and algorithms that pertain to the evaluation of patients at risk for COVID-19 are in a state of rapid change based on information released by regulatory bodies including the CDC and federal and state organizations. These policies and algorithms were followed during the patient's care in the  ED.  I reviewed the patient's prescription history over the last 12 months in the multi-state controlled substances database(s) that includes [place], [place], [place], [place], [place], [place], [place], [place], [place], [place], [place], [place], [place], and [place].  Results were notable for no RX history ____________________________________________  FINAL CLINICAL IMPRESSION(S) / ED DIAGNOSES  Final diagnoses:  Abscess      Carmie End, Dannielle Karvonen, PA-C 07/25/19 1927    Nena Polio, MD 07/25/19 2105

## 2019-07-25 NOTE — Discharge Instructions (Addendum)
Take the antibiotic as directed. Apply warm compress to promote healing. Follow-up with [facility] or this ED for interim wound check in 3 days.

## 2021-10-12 ENCOUNTER — Encounter: Payer: Self-pay | Admitting: Podiatry

## 2021-10-12 ENCOUNTER — Other Ambulatory Visit: Payer: Self-pay

## 2021-10-12 ENCOUNTER — Ambulatory Visit (INDEPENDENT_AMBULATORY_CARE_PROVIDER_SITE_OTHER): Payer: Managed Care, Other (non HMO) | Admitting: Podiatry

## 2021-10-12 DIAGNOSIS — L600 Ingrowing nail: Secondary | ICD-10-CM | POA: Diagnosis not present

## 2021-10-12 NOTE — Progress Notes (Signed)
°  Subjective:  Patient ID: Dana Mccall, female    DOB: June 10, 1986,  MRN: [id number]  Chief Complaint  Patient presents with   Ingrown Toenail    Bilateral ingrown     35 y.o. female presents with the above complaint.  Patient presents with complaint left medial border of the hallux ingrown.  Patient states painful to touch painful to walk on.  She is tried some self debridement none of which has helped.  She would like to have removed.  She has not seen anyone else prior to seeing me.  She was on antibiotics she has completed the course.  She denies any other acute issues.  She would like to have it removed.   Review of Systems: Negative except as noted in the HPI. Denies N/V/F/Ch.  No past medical history on file.  Current Outpatient Medications:    dicyclomine (BENTYL) 20 MG tablet, Take 1 tablet (20 mg total) by mouth 3 (three) times daily as needed for spasms., Disp: 30 tablet, Rfl: 0   ondansetron (ZOFRAN) 4 MG tablet, Take 1 tablet (4 mg total) by mouth daily as needed for nausea or vomiting., Disp: 20 tablet, Rfl: 1   sulfamethoxazole-trimethoprim (BACTRIM DS) 800-160 MG tablet, Take 1 tablet by mouth 2 (two) times daily., Disp: 20 tablet, Rfl: 0  Social History   Tobacco Use  Smoking Status Some Days  Smokeless Tobacco Never    No Known Allergies Objective:  There were no vitals filed for this visit. There is no height or weight on file to calculate BMI. Constitutional Well developed. Well nourished.  Vascular Dorsalis pedis pulses palpable bilaterally. Posterior tibial pulses palpable bilaterally. Capillary refill normal to all digits.  No cyanosis or clubbing noted. Pedal hair growth normal.  Neurologic Normal speech. Oriented to person, place, and time. Epicritic sensation to light touch grossly present bilaterally.  Dermatologic Painful ingrowing nail at medial nail borders of the hallux nail left. No other open wounds. No skin lesions.  Orthopedic: Normal  joint ROM without pain or crepitus bilaterally. No visible deformities. No bony tenderness.   Radiographs: None Assessment:   1. Ingrown left big toenail    Plan:  Patient was evaluated and treated and all questions answered.  Ingrown Nail, left -Patient elects to proceed with minor surgery to remove ingrown toenail removal today. Consent reviewed and signed by patient. -Ingrown nail excised. See procedure note. -Educated on post-procedure care including soaking. Written instructions provided and reviewed. -Patient to follow up in 2 weeks for nail check.  Procedure: Excision of Ingrown Toenail Location: Left 1st toe medial nail borders. Anesthesia: Lidocaine 1% plain; 1.5 mL and Marcaine 0.5% plain; 1.5 mL, digital block. Skin Prep: Betadine. Dressing: Silvadene; telfa; dry, sterile, compression dressing. Technique: Following skin prep, the toe was exsanguinated and a tourniquet was secured at the base of the toe. The affected nail border was freed, split with a nail splitter, and excised. Chemical matrixectomy was then performed with phenol and irrigated out with alcohol. The tourniquet was then removed and sterile dressing applied. Disposition: Patient tolerated procedure well. Patient to return in 2 weeks for follow-up.   No follow-ups on file.

## 2022-10-16 ENCOUNTER — Ambulatory Visit (INDEPENDENT_AMBULATORY_CARE_PROVIDER_SITE_OTHER): Payer: Medicaid Other

## 2022-10-16 ENCOUNTER — Ambulatory Visit: Payer: Medicaid Other | Admitting: Podiatry

## 2022-10-16 DIAGNOSIS — Z01818 Encounter for other preprocedural examination: Secondary | ICD-10-CM | POA: Diagnosis not present

## 2022-10-16 DIAGNOSIS — M21611 Bunion of right foot: Secondary | ICD-10-CM | POA: Diagnosis not present

## 2022-10-16 DIAGNOSIS — M21619 Bunion of unspecified foot: Secondary | ICD-10-CM

## 2022-10-18 ENCOUNTER — Telehealth: Payer: Self-pay

## 2022-10-18 NOTE — Telephone Encounter (Signed)
Received a call from [place] at [facility]. She stated Kaisey called to cancel her surgery with Dr. Allena Katz on 10/26/2022. Patient stated she has an appointment for her son that she can't move. I called and left her a message to call me so we can get her rescheduled.

## 2022-10-23 NOTE — Progress Notes (Signed)
Subjective:  Patient ID: Dana Mccall, female    DOB: 1985-11-27,  MRN: [id number]  Chief Complaint  Patient presents with   Bunions    Patient is here for bunion on right foot that has been hurting for several years.    36 y.o. female presents with the above complaint.  Patient presents with right moderate bunion pain.  Patient states that it is painful to touch is progressive gotten worse hurts with ambulation is going on for several years she has tried padding protecting shoe gear modification none of which has helped.  She wanted to get it evaluated she would like to discuss surgical options at this time she has failed all conservative options.  Pain scale 7 out of 10  Review of Systems: Negative except as noted in the HPI. Denies N/V/F/Ch.  No past medical history on file.  Current Outpatient Medications:    dicyclomine (BENTYL) 20 MG tablet, Take 1 tablet (20 mg total) by mouth 3 (three) times daily as needed for spasms., Disp: 30 tablet, Rfl: 0   ondansetron (ZOFRAN) 4 MG tablet, Take 1 tablet (4 mg total) by mouth daily as needed for nausea or vomiting., Disp: 20 tablet, Rfl: 1   sulfamethoxazole-trimethoprim (BACTRIM DS) 800-160 MG tablet, Take 1 tablet by mouth 2 (two) times daily., Disp: 20 tablet, Rfl: 0  Social History   Tobacco Use  Smoking Status Some Days  Smokeless Tobacco Never    No Known Allergies Objective:  There were no vitals filed for this visit. There is no height or weight on file to calculate BMI. Constitutional Well developed. Well nourished.  Vascular Dorsalis pedis pulses palpable bilaterally. Posterior tibial pulses palpable bilaterally. Capillary refill normal to all digits.  No cyanosis or clubbing noted. Pedal hair growth normal.  Neurologic Normal speech. Oriented to person, place, and time. Epicritic sensation to light touch grossly present bilaterally.  Dermatologic Nails well groomed and normal in appearance. No open wounds. No skin  lesions.  Orthopedic: Normal joint ROM without pain or crepitus bilaterally. Hallux abductovalgus deformity present this is a track bound not a tracking deformity no intra-articular first MPJ pain noted Left 1st MPJ diminished range of motion. Left 1st TMT without gross hypermobility. Right 1st MPJ diminished range of motion  Right 1st TMT without gross hypermobility. Lesser digital contractures absent bilaterally.   Radiographs: Taken and reviewed. Hallux abductovalgus deformity present. Metatarsal parabola normal. 1st/2nd IMA: Moderate bunion deformity; TSP: 6 out of 7  Assessment:   1. Bunion   2. Encounter for preoperative examination for general surgical procedure    Plan:  Patient was evaluated and treated and all questions answered.  Hallux abductovalgus deformity, right foot -XR as above. -Patient has failed all conservative therapy and wishes to proceed with surgical intervention. All risks, benefits, and alternatives discussed with patient. No guarantees given. Consent reviewed and signed by patient. Post-op course explained at length. -Planned procedures: Chevron osteotomy with a possible phalangeal osteotomy -Risk factors: None -I discussed my preoperative intraoperative3 views of skin trauma showed a right plan with the patient in extensive detail.  Given that she has failed all conservative treatment options she will benefit from surgical correction of the bunion.  She states understand like to proceed with surgery -Informed surgical risk consent was reviewed and read aloud to the patient.  I reviewed the films.  I have discussed my findings with the patient in great detail.  I have discussed all risks including but not limited to infection, stiffness, scarring,  limp, disability, deformity, damage to blood vessels and nerves, numbness, poor healing, need for braces, arthritis, chronic pain, amputation, death.  All benefits and realistic expectations discussed in great detail.   I have made no promises as to the outcome.  I have provided realistic expectations.  I have offered the patient a 2nd opinion, which they have declined and assured me they preferred to proceed despite the risks   No follow-ups on file.

## 2022-11-01 ENCOUNTER — Encounter: Payer: Medicaid Other | Admitting: Podiatry

## 2022-11-16 ENCOUNTER — Encounter: Payer: Medicaid Other | Admitting: Podiatry

## 2022-11-16 ENCOUNTER — Telehealth: Payer: Self-pay | Admitting: Podiatry

## 2022-11-16 NOTE — Telephone Encounter (Addendum)
DOS: 12/17/2022  [place] Medicaid [employer] Effective 09/28/2022  [place] Bunionectomy Rt [phone])  Authorization #: [id number] Authorization Valid: 12/17/2022 - 03/17/2023

## 2022-12-17 ENCOUNTER — Encounter: Payer: Self-pay | Admitting: Podiatry

## 2022-12-17 ENCOUNTER — Telehealth: Payer: Self-pay | Admitting: *Deleted

## 2022-12-17 ENCOUNTER — Other Ambulatory Visit: Payer: Self-pay | Admitting: Podiatry

## 2022-12-17 DIAGNOSIS — M2011 Hallux valgus (acquired), right foot: Secondary | ICD-10-CM | POA: Diagnosis not present

## 2022-12-17 MED ORDER — IBUPROFEN 800 MG PO TABS: 800.0000 mg | ORAL_TABLET | Freq: Four times a day (QID) | ORAL | 1 refills | Status: DC | PRN

## 2022-12-17 MED ORDER — OXYCODONE-ACETAMINOPHEN 5-325 MG PO TABS: 1.0000 | ORAL_TABLET | ORAL | 0 refills | Status: DC | PRN

## 2022-12-17 NOTE — Telephone Encounter (Signed)
Patient is calling to ask were medication has been sent, explained that it was sent to [facility] in [place], will pick up there this time but for future references, please send to  [place].

## 2022-12-19 ENCOUNTER — Telehealth: Payer: Self-pay | Admitting: Podiatry

## 2022-12-19 ENCOUNTER — Telehealth: Payer: Self-pay

## 2022-12-19 NOTE — Telephone Encounter (Signed)
Patient is aware 

## 2022-12-19 NOTE — Telephone Encounter (Signed)
Patient has an appt today.

## 2022-12-19 NOTE — Telephone Encounter (Signed)
Pt called asking if she could extend her time out of work until next Monday or maybe Tuesday. She had surgery on Monday and is still having pain as the block is just wearing off good. She is not taking the narcotics as it makes her feel weird. She is only taking the 800 ibuprofen.   She was asking about taking the boot off as it feel tight and I transferred her to the nurse to discuss that further.

## 2022-12-19 NOTE — Telephone Encounter (Signed)
Disregard wrong patient.

## 2022-12-20 ENCOUNTER — Telehealth: Payer: Self-pay | Admitting: Podiatry

## 2022-12-20 ENCOUNTER — Encounter: Payer: Self-pay | Admitting: Podiatry

## 2022-12-20 NOTE — Telephone Encounter (Signed)
Pt called again asking for a note to return to work next Tuesday after having surgery on 2.19. She was given a note to be out thru 2.21 and return to light duty until next post op  appt on 3.1

## 2022-12-28 ENCOUNTER — Ambulatory Visit (INDEPENDENT_AMBULATORY_CARE_PROVIDER_SITE_OTHER): Payer: Medicaid Other

## 2022-12-28 ENCOUNTER — Ambulatory Visit (INDEPENDENT_AMBULATORY_CARE_PROVIDER_SITE_OTHER): Payer: Medicaid Other | Admitting: Podiatry

## 2022-12-28 ENCOUNTER — Encounter: Payer: Self-pay | Admitting: Podiatry

## 2022-12-28 ENCOUNTER — Other Ambulatory Visit: Payer: Self-pay | Admitting: Podiatry

## 2022-12-28 DIAGNOSIS — M21619 Bunion of unspecified foot: Secondary | ICD-10-CM

## 2022-12-28 DIAGNOSIS — M216X1 Other acquired deformities of right foot: Secondary | ICD-10-CM | POA: Diagnosis not present

## 2022-12-28 DIAGNOSIS — Z9889 Other specified postprocedural states: Secondary | ICD-10-CM

## 2023-01-11 ENCOUNTER — Ambulatory Visit (INDEPENDENT_AMBULATORY_CARE_PROVIDER_SITE_OTHER): Payer: Medicaid Other | Admitting: Podiatry

## 2023-01-11 ENCOUNTER — Encounter: Payer: Self-pay | Admitting: Podiatry

## 2023-01-11 VITALS — BP 139/82 | HR 66

## 2023-01-11 DIAGNOSIS — M21619 Bunion of unspecified foot: Secondary | ICD-10-CM

## 2023-01-11 DIAGNOSIS — Z9889 Other specified postprocedural states: Secondary | ICD-10-CM

## 2023-01-11 NOTE — Progress Notes (Signed)
  Subjective:  Patient ID: Dana Mccall, female    DOB: 1986-05-27,  MRN: [id number]  Chief Complaint  Patient presents with   Routine Post Op    "It's doing well, feels a lot better."    DOS: 12/17/2021 Procedure: Right bunionectomy  37 y.o. female returns for post-op check.  Patient states she is doing a lot better.  She is here to get her stitches out.  She denies any other acute complaints has healed considerably well.  Pain is controlled  Review of Systems: Negative except as noted in the HPI. Denies N/V/F/Ch.  No past medical history on file.  Current Outpatient Medications:    ibuprofen (ADVIL) 800 MG tablet, Take 1 tablet (800 mg total) by mouth every 6 (six) hours as needed., Disp: 60 tablet, Rfl: 1   dicyclomine (BENTYL) 20 MG tablet, Take 1 tablet (20 mg total) by mouth 3 (three) times daily as needed for spasms., Disp: 30 tablet, Rfl: 0   ondansetron (ZOFRAN) 4 MG tablet, Take 1 tablet (4 mg total) by mouth daily as needed for nausea or vomiting. (Patient not taking: Reported on 01/11/2023), Disp: 20 tablet, Rfl: 1   oxyCODONE-acetaminophen (PERCOCET) 5-325 MG tablet, Take 1-2 tablets by mouth every 4 (four) hours as needed for severe pain. (Patient not taking: Reported on 01/11/2023), Disp: 30 tablet, Rfl: 0   sulfamethoxazole-trimethoprim (BACTRIM DS) 800-160 MG tablet, Take 1 tablet by mouth 2 (two) times daily. (Patient not taking: Reported on 01/11/2023), Disp: 20 tablet, Rfl: 0  Social History   Tobacco Use  Smoking Status Some Days  Smokeless Tobacco Never    No Known Allergies Objective:   Vitals:   01/11/23 1035  BP: 139/82  Pulse: 66   There is no height or weight on file to calculate BMI. Constitutional Well developed. Well nourished.  Vascular Foot warm and well perfused. Capillary refill normal to all digits.   Neurologic Normal speech. Oriented to person, place, and time. Epicritic sensation to light touch grossly present bilaterally.   Dermatologic Skin completely epithelialized.  No signs of Deis is noted.  Good range of motion noted at the first metatarsophalangeal joint no pain  Orthopedic: No pain tenderness to palpation noted about the surgical site.   Radiographs: None Assessment:  No diagnosis found. Plan:  Patient was evaluated and treated and all questions answered.  S/p foot surgery right -Progressing as expected post-operatively. -XR: See above -WB Status: Weightbearing as tolerated in regular shoes -Sutures: Removed no signs of Deis is noted no complication noted -Medications: None -Foot redressed.  No follow-ups on file.

## 2023-01-11 NOTE — Progress Notes (Signed)
  Subjective:  Patient ID: Dana Mccall, female    DOB: Feb 28, 1986,  MRN: [id number]  No chief complaint on file.   DOS: 12/17/2021 Procedure: Right bunionectomy  37 y.o. female returns for post-op check.  Patient states that she is doing well.  Some pain.  Overall improving bandages clean dry and intact.  No nausea fever chills vomiting  Review of Systems: Negative except as noted in the HPI. Denies N/V/F/Ch.  No past medical history on file.  Current Outpatient Medications:    dicyclomine (BENTYL) 20 MG tablet, Take 1 tablet (20 mg total) by mouth 3 (three) times daily as needed for spasms., Disp: 30 tablet, Rfl: 0   ibuprofen (ADVIL) 800 MG tablet, Take 1 tablet (800 mg total) by mouth every 6 (six) hours as needed., Disp: 60 tablet, Rfl: 1   ondansetron (ZOFRAN) 4 MG tablet, Take 1 tablet (4 mg total) by mouth daily as needed for nausea or vomiting. (Patient not taking: Reported on 01/11/2023), Disp: 20 tablet, Rfl: 1   oxyCODONE-acetaminophen (PERCOCET) 5-325 MG tablet, Take 1-2 tablets by mouth every 4 (four) hours as needed for severe pain. (Patient not taking: Reported on 01/11/2023), Disp: 30 tablet, Rfl: 0   sulfamethoxazole-trimethoprim (BACTRIM DS) 800-160 MG tablet, Take 1 tablet by mouth 2 (two) times daily. (Patient not taking: Reported on 01/11/2023), Disp: 20 tablet, Rfl: 0  Social History   Tobacco Use  Smoking Status Some Days  Smokeless Tobacco Never    No Known Allergies Objective:  There were no vitals filed for this visit. There is no height or weight on file to calculate BMI. Constitutional Well developed. Well nourished.  Vascular Foot warm and well perfused. Capillary refill normal to all digits.   Neurologic Normal speech. Oriented to person, place, and time. Epicritic sensation to light touch grossly present bilaterally.  Dermatologic Skin healing well without signs of infection. Skin edges well coapted without signs of infection.  Orthopedic:  Tenderness to palpation noted about the surgical site.   Radiographs: 3 views of sketcher mature the right foot: Reduction of bunion deformity noted.  Hardware is intact.  No signs of backing or loosening noted Assessment:   1. Bunion   2. Status post foot surgery    Plan:  Patient was evaluated and treated and all questions answered.  S/p foot surgery right -Progressing as expected post-operatively. -XR: See above -WB Status: Weightbearing as tolerated in cam boot -Sutures: Intact.  No clinical signs of Deis is noted.  No complication noted. -Medications: None -Foot redressed.  No follow-ups on file.

## 2023-05-01 ENCOUNTER — Emergency Department
Admission: EM | Admit: 2023-05-01 | Discharge: 2023-05-01 | Disposition: A | Discharge status: Home / Self Care | Payer: Medicaid Other | Attending: Emergency Medicine | Admitting: Emergency Medicine

## 2023-05-01 ENCOUNTER — Emergency Department: Payer: Medicaid Other

## 2023-05-01 ENCOUNTER — Encounter: Payer: Self-pay | Admitting: Emergency Medicine

## 2023-05-01 ENCOUNTER — Other Ambulatory Visit: Payer: Self-pay

## 2023-05-01 DIAGNOSIS — N83202 Unspecified ovarian cyst, left side: Secondary | ICD-10-CM | POA: Diagnosis not present

## 2023-05-01 DIAGNOSIS — O200 Threatened abortion: Secondary | ICD-10-CM | POA: Diagnosis present

## 2023-05-01 LAB — COMPREHENSIVE METABOLIC PANEL
ALT: 24 U/L (ref 0–44)
AST: 23 U/L (ref 15–41)
Albumin: 4.4 g/dL (ref 3.5–5.0)
Alkaline Phosphatase: 41 U/L (ref 38–126)
Anion gap: 9 (ref 5–15)
BUN: 16 mg/dL (ref 6–20)
CO2: 23 mmol/L (ref 22–32)
Calcium: 9.5 mg/dL (ref 8.9–10.3)
Chloride: 107 mmol/L (ref 98–111)
Creatinine, Ser: 0.87 mg/dL (ref 0.44–1.00)
GFR, Estimated: >60 mL/min (ref >60)
Glucose, Bld: 98 mg/dL (ref 70–99)
Potassium: 3.9 mmol/L (ref 3.5–5.1)
Sodium: 139 mmol/L (ref 135–145)
Total Bilirubin: 0.4 mg/dL (ref 0.3–1.2)
Total Protein: 7.4 g/dL (ref 6.5–8.1)

## 2023-05-01 LAB — URINALYSIS, ROUTINE W REFLEX MICROSCOPIC
Bacteria, UA: NONE SEEN
Bilirubin Urine: NEGATIVE
Glucose, UA: NEGATIVE mg/dL
Ketones, ur: NEGATIVE mg/dL
Leukocytes,Ua: NEGATIVE
Nitrite: NEGATIVE
Protein, ur: NEGATIVE mg/dL
RBC / HPF: >50 RBC/hpf (ref 0–5)
Specific Gravity, Urine: 1.015 (ref 1.005–1.030)
pH: 5 (ref 5.0–8.0)

## 2023-05-01 LAB — CBC
HCT: 34.1 % — ABNORMAL LOW (ref 36.0–46.0)
Hemoglobin: 11.6 g/dL — ABNORMAL LOW (ref 12.0–15.0)
MCH: 34.6 pg — ABNORMAL HIGH (ref 26.0–34.0)
MCHC: 34 g/dL (ref 30.0–36.0)
MCV: 101.8 fL — ABNORMAL HIGH (ref 80.0–100.0)
Platelets: 327 10*3/uL (ref 150–400)
RBC: 3.35 MIL/uL — ABNORMAL LOW (ref 3.87–5.11)
RDW: 11.9 % (ref 11.5–15.5)
WBC: 10.4 10*3/uL (ref 4.0–10.5)
nRBC: 0 % (ref 0.0–0.2)

## 2023-05-01 LAB — ABO/RH: ABO/RH(D): O POS

## 2023-05-01 LAB — POC URINE PREG, ED: Preg Test, Ur: POSITIVE — AB

## 2023-05-01 LAB — HCG, QUANTITATIVE, PREGNANCY: hCG, Beta Chain, Quant, S: 667 m[IU]/mL — ABNORMAL HIGH (ref <5)

## 2023-05-01 NOTE — ED Triage Notes (Signed)
Pt c/o intermitting lower abdominal pain described as cramping radiating into the left lower back. Currently denies pain. Pt sts she is currently 5-6 weeks pregnancy. Positive at home pregnancy test. Endorse vaginal bleeding that started yesterday morning. Not saturating pads, noticed bleeding when wiping or going to the bathroom.  No burning with urination and pain with urination.

## 2023-05-01 NOTE — ED Provider Notes (Signed)
[facility] Provider Note    Event Date/Time   First MD Initiated Contact with Patient 05/01/23 [phone]     (approximate)   History   Abdominal Pain   HPI  Dana Mccall is a 37 y.o. female G4, P3 at approximately [redacted] weeks pregnant by dates who presents to the emergency department from home with a chief complaint of pelvic cramping pain radiating into her left lower back.  Endorses occasional nausea.  Vaginal bleeding began yesterday morning while sitting at work and became heavier during the day.  Patient is not saturating maxi pad; she is noticing vaginal bleeding when wiping or urinating.  Denies fever/chills, cough, chest pain, shortness of breath, vomiting, dysuria or vaginal discharge.  No concerns for STDs.     Past Medical History  History reviewed. No pertinent past medical history.   Active Problem List  There are no problems to display for this patient.    Past Surgical History  History reviewed. No pertinent surgical history.   Home Medications   Prior to Admission medications   Medication Sig Start Date End Date Taking? Authorizing Provider  dicyclomine (BENTYL) 20 MG tablet Take 1 tablet (20 mg total) by mouth 3 (three) times daily as needed for spasms. 04/24/16 04/24/17  Emily Filbert, MD  ibuprofen (ADVIL) 800 MG tablet Take 1 tablet (800 mg total) by mouth every 6 (six) hours as needed. 12/17/22   Candelaria Stagers, DPM  ondansetron (ZOFRAN) 4 MG tablet Take 1 tablet (4 mg total) by mouth daily as needed for nausea or vomiting. Patient not taking: Reported on 01/11/2023 04/24/16   Emily Filbert, MD  oxyCODONE-acetaminophen (PERCOCET) 5-325 MG tablet Take 1-2 tablets by mouth every 4 (four) hours as needed for severe pain. Patient not taking: Reported on 01/11/2023 12/17/22   Candelaria Stagers, DPM  sulfamethoxazole-trimethoprim (BACTRIM DS) 800-160 MG tablet Take 1 tablet by mouth 2 (two) times daily. Patient not taking: Reported on  01/11/2023 07/25/19   Menshew, Charlesetta Ivory, PA-C     Allergies  Patient has no known allergies.   Family History  History reviewed. No pertinent family history.   Physical Exam  Triage Vital Signs: ED Triage Vitals  Enc Vitals Group     BP 05/01/23 0319 132/79     Pulse Rate 05/01/23 0319 72     Resp 05/01/23 0319 16     Temp 05/01/23 0319 98.6 F (37 C)     Temp Source 05/01/23 0319 Oral     SpO2 05/01/23 0319 100 %     Weight 05/01/23 0317 160 lb (72.6 kg)     Height 05/01/23 0317 5' (1.524 m)     Head Circumference --      Peak Flow --      Pain Score 05/01/23 0317 4     Pain Loc --      Pain Edu? --      Excl. in GC? --     Updated Vital Signs: BP 137/81   Pulse 66   Temp 98.6 F (37 C) (Oral)   Resp 16   Ht 5' (1.524 m)   Wt 72.6 kg   LMP 03/22/2023 (Approximate)   SpO2 100%   BMI 31.25 kg/m    General: Awake, no distress.  CV:  RRR.  Good peripheral perfusion.  Resp:  Normal effort.  CTAB. Abd:  Nontender to light or deep palpation.  No distention.  Other:  No truncal vesicles.  ED Results / Procedures / Treatments  Labs (all labs ordered are listed, but only abnormal results are displayed) Labs Reviewed  CBC - Abnormal; Notable for the following components:      Result Value   RBC 3.35 (*)    Hemoglobin 11.6 (*)    HCT 34.1 (*)    MCV 101.8 (*)    MCH 34.6 (*)    All other components within normal limits  URINALYSIS, ROUTINE W REFLEX MICROSCOPIC - Abnormal; Notable for the following components:   Color, Urine YELLOW (*)    APPearance HAZY (*)    Hgb urine dipstick LARGE (*)    Non Squamous Epithelial PRESENT (*)    All other components within normal limits  HCG, QUANTITATIVE, PREGNANCY - Abnormal; Notable for the following components:   hCG, Beta Chain, Quant, S 667 (*)    All other components within normal limits  POC URINE PREG, ED - Abnormal; Notable for the following components:   Preg Test, Ur Positive (*)    All other  components within normal limits  COMPREHENSIVE METABOLIC PANEL  ABO/RH  ABO/RH     EKG  None   RADIOLOGY I have independently visualized and interpreted patient's ultrasound as well as noted the radiology interpretation:  OB ultrasound: 5-week 1 day probable early IU gestational sac, left ovarian cyst  Official radiology report(s): US OB LESS THAN 14 WEEKS WITH OB TRANSVAGINAL  Result Date: 05/01/2023 CLINICAL DATA:  Initial evaluation for early pregnancy, vaginal spotting. EXAM: OBSTETRIC <14 WK [place] AND TRANSVAGINAL OB US TECHNIQUE: Both transabdominal and transvaginal ultrasound examinations were performed for complete evaluation of the gestation as well as the maternal uterus, adnexal regions, and pelvic cul-de-sac. Transvaginal technique was performed to assess early pregnancy. COMPARISON:  None Available. FINDINGS: Intrauterine gestational sac: Single. Yolk sac:  Negative. Embryo:  Negative. Cardiac Activity: Negative. Heart Rate: N/A MSD: 4.1 mm   5 w   1 d Subchorionic hemorrhage:  None visualized. Maternal uterus/adnexae: Right ovary within normal limits. 1.7 x 1.2 x 1.3 cm corpus luteal cyst noted within the left ovary. No other adnexal mass or free fluid. IMPRESSION: 1. Probable early intrauterine gestational sac, but no yolk sac, fetal pole, or cardiac activity yet visualized. Recommend follow-up quantitative B-HCG levels and follow-up US in 14 days to confirm and assess viability. This recommendation follows SRU consensus guidelines: Diagnostic Criteria for Nonviable Pregnancy Early in the First Trimester. Malva Limes Med 2013; 914:7829-56. 2. 1.7 cm left ovarian corpus luteal cyst. Electronically Signed   By: Rise Mu M.D.   On: 05/01/2023 05:56     PROCEDURES:  Critical Care performed: No  Procedures   MEDICATIONS ORDERED IN ED: Medications - No data to display   IMPRESSION / MDM / ASSESSMENT AND PLAN / ED COURSE  I reviewed the triage vital signs and the  nursing notes.                             37 year old female G4, P3 approximately [redacted] weeks pregnant by dates presenting with vaginal bleeding. Differential diagnosis includes, but is not limited to, ovarian cyst, ovarian torsion, acute appendicitis, diverticulitis, urinary tract infection/pyelonephritis, endometriosis, bowel obstruction, colitis, renal colic, gastroenteritis, hernia, fibroids, endometriosis, pregnancy related pain including ectopic pregnancy, etc. I personally reviewed patient's records and note a podiatry office visit from 01/11/2023.  Her last baby was in 2015; I cannot find a record of her blood type so will  obtain ABO/Rh.  Patient's presentation is most consistent with acute presentation with potential threat to life or bodily function.  Hemoglobin unremarkable at 11.6, UA negative for infection.  Have added beta hCG, ABO/Rh.  Will obtain OB ultrasound and reassess.  Clinical Course as of 05/01/23 0644  Wed May 01, 2023  7829 Patient on all lab results and ultrasound.  Patient states she has an appointment at [facility] this morning at 10 AM because she desires elective abortion.  She will follow-up with her appointment as scheduled.  Strict return precautions given.  Patient verbalizes understanding and agrees with plan of care. [JS]    Clinical Course User Index [JS] Irean Hong, MD     FINAL CLINICAL IMPRESSION(S) / ED DIAGNOSES   Final diagnoses:  Threatened miscarriage in early pregnancy  Cyst of left ovary     Rx / DC Orders   ED Discharge Orders     None        Note:  This document was prepared using Dragon voice recognition software and may include unintentional dictation errors.   Irean Hong, MD 05/01/23 [phone]

## 2023-05-01 NOTE — Discharge Instructions (Signed)
Keep your appointment today as scheduled.  Return to the ER for worsening symptoms, persistent vomiting, soaking more than 1 pad per hour, fainting or other concerns.

## 2023-05-20 ENCOUNTER — Encounter: Payer: Self-pay | Admitting: Obstetrics

## 2023-05-20 ENCOUNTER — Ambulatory Visit (INDEPENDENT_AMBULATORY_CARE_PROVIDER_SITE_OTHER): Payer: Medicaid Other | Admitting: Obstetrics

## 2023-05-20 VITALS — BP 123/72 | HR 75 | Wt 160.3 lb

## 2023-05-20 DIAGNOSIS — O039 Complete or unspecified spontaneous abortion without complication: Secondary | ICD-10-CM | POA: Insufficient documentation

## 2023-05-20 DIAGNOSIS — Z3A01 Less than 8 weeks gestation of pregnancy: Secondary | ICD-10-CM

## 2023-05-20 DIAGNOSIS — Z3202 Encounter for pregnancy test, result negative: Secondary | ICD-10-CM

## 2023-05-20 LAB — POCT URINE PREGNANCY: Preg Test, Ur: NEGATIVE

## 2023-05-20 NOTE — Progress Notes (Signed)
Obstetrics & Gynecology Office Visit   Chief Complaint:  Chief Complaint  Patient presents with   ER Follow Up    History of Present Illness: Dana Mccall is a new patient who presents for follow up forma visit at the Ed last month. She was seen for vaginal bleeding with a suspected pregnancy, and her work up there led to a diagnosis of likely miscarriage at approximately [redacted] weeks gestation.  At that time, she intended to end her pregnancy and was going to contact [facility] for a scheduled elective abortion. As she bled for 5 days, and her ED sono indicated SAB, she did not go there.  The ultrasound also made note of a Left luteal cyst and she is concerned about this. She was instructed to follow up at [facility]. She is just now being seen , weeks later. She is no longer bleeding. She is sexually active and not using contraception.she has one current partner. Dana Mccall works for [employer]. She uses Dana Mccall for her PCP care.   Review of Systems:  Review of Systems  Constitutional: Negative.   HENT: Negative.    Eyes: Negative.   Cardiovascular: Negative.   Gastrointestinal: Negative.   Genitourinary: Negative.   Skin: Negative.   Neurological:  Positive for dizziness.     Past Medical History:  History reviewed. No pertinent past medical history.  Past Surgical History:  History reviewed. No pertinent surgical history.  Gynecologic History: Patient's last menstrual period was 03/22/2023 (approximate).  Obstetric History: G1P0  Family History:  History reviewed. No pertinent family history.  Social History:  Social History   Socioeconomic History   Marital status: Single    Spouse name: Not on file   Number of children: Not on file   Years of education: Not on file   Highest education level: Not on file  Occupational History   Not on file  Tobacco Use   Smoking status: Some Days   Smokeless tobacco: Never  Substance and Sexual Activity   Alcohol use:  Yes    Comment: ocassionally   Drug use: No   Sexual activity: Not on file  Other Topics Concern   Not on file  Social History Narrative   Not on file   Social Determinants of Health   Financial Resource Strain: Not on file  Food Insecurity: Not on file  Transportation Needs: Not on file  Physical Activity: Not on file  Stress: Not on file  Social Connections: Not on file  Intimate Partner Violence: Not on file    Allergies:  No Known Allergies  Medications: Prior to Admission medications   Medication Sig Start Date End Date Taking? Authorizing Provider  ibuprofen (ADVIL) 800 MG tablet Take 1 tablet (800 mg total) by mouth every 6 (six) hours as needed. 12/17/22  Yes Dana Mccall, DPM  dicyclomine (BENTYL) 20 MG tablet Take 1 tablet (20 mg total) by mouth 3 (three) times daily as needed for spasms. 04/24/16 04/24/17  Dana Filbert, MD    Physical Exam Vitals:  Vitals:   05/20/23 0859  BP: 123/72  Pulse: 75   Patient's last menstrual period was 03/22/2023 (approximate).  Physical Exam Vitals and nursing note reviewed.  Constitutional:      Appearance: Normal appearance.  HENT:     Head: Normocephalic and atraumatic.  Cardiovascular:     Rate and Rhythm: Normal rate and regular rhythm.     Pulses: Normal pulses.     Heart sounds:  Normal heart sounds.  Pulmonary:     Effort: Pulmonary effort is normal.     Breath sounds: Normal breath sounds.  Abdominal:     Palpations: Abdomen is soft.     Comments: Adipose.   Genitourinary:    General: Normal vulva.     Rectum: Normal.     Comments: No external lesions Normal vaginal mucosa. No visible bleeding or unusual discharge. Uterus is non enlarged, midline and mobile. Musculoskeletal:        General: Normal range of motion.     Cervical back: Normal range of motion.  Skin:    General: Skin is warm and dry.  Neurological:     General: No focal deficit present.     Mental Status: She is alert and  oriented to person, place, and time.  Psychiatric:        Mood and Affect: Mood normal.        Behavior: Behavior normal.   Odor of marijuana noted in the exam room. O: UPT is negative   Assessment: 37 y.o. G1P0 for f/u on SAB. Left luteal cyst Desires contraception Plan: Problem List Items Addressed This Visit       Other   SAB (spontaneous abortion) - Primary   Relevant Orders   Human Chorionic Gonadotropin (hCG),Quantitative (Serial Monitor)   POCT urine pregnancy (Completed)  Quantitative HCG drawn. We will notify her of the results. We discussed contraception and desired methods. She has had Nexplanon in the past. I reviewed LARCs with her as she is not interested in conceiving. She is an occasional smoker, and I addressed increased risk for cardiovascular issues with OCPs. LARCs suggested. She is going to make another appointment for eith and IUD or Nexplanon placement. Encouraged to make a Well woman GYN exam.  Dana Mccall, CNM  05/20/2023 5:47 PM

## 2023-05-21 LAB — HUMAN CHORIONIC GONADOTROPIN(HCG),B-SUBUNIT,QUANTITATIVE): HCG, Beta Chain, Quant, S: <1 m[IU]/mL

## 2023-05-22 ENCOUNTER — Encounter: Payer: Self-pay | Admitting: Obstetrics

## 2023-11-28 ENCOUNTER — Ambulatory Visit (INDEPENDENT_AMBULATORY_CARE_PROVIDER_SITE_OTHER): Payer: Medicaid Other | Admitting: Obstetrics and Gynecology

## 2023-11-28 ENCOUNTER — Encounter: Payer: Self-pay | Admitting: Obstetrics and Gynecology

## 2023-11-28 ENCOUNTER — Other Ambulatory Visit (HOSPITAL_COMMUNITY)
Admission: RE | Admit: 2023-11-28 | Discharge: 2023-11-28 | Disposition: A | Discharge status: Home / Self Care | Payer: Medicaid Other | Source: Ambulatory Visit | Attending: Obstetrics and Gynecology | Admitting: Obstetrics and Gynecology

## 2023-11-28 VITALS — BP 129/68 | HR 99 | Wt 155.0 lb

## 2023-11-28 DIAGNOSIS — Z124 Encounter for screening for malignant neoplasm of cervix: Secondary | ICD-10-CM | POA: Insufficient documentation

## 2023-11-28 DIAGNOSIS — Z9889 Other specified postprocedural states: Secondary | ICD-10-CM

## 2023-11-28 DIAGNOSIS — Z1151 Encounter for screening for human papillomavirus (HPV): Secondary | ICD-10-CM | POA: Insufficient documentation

## 2023-11-28 DIAGNOSIS — N939 Abnormal uterine and vaginal bleeding, unspecified: Secondary | ICD-10-CM | POA: Insufficient documentation

## 2023-11-28 DIAGNOSIS — N644 Mastodynia: Secondary | ICD-10-CM | POA: Diagnosis not present

## 2023-11-28 DIAGNOSIS — Z3202 Encounter for pregnancy test, result negative: Secondary | ICD-10-CM

## 2023-11-28 LAB — POCT URINE PREGNANCY: Preg Test, Ur: NEGATIVE

## 2023-11-28 NOTE — Progress Notes (Signed)
Center, [facility]   Chief Complaint  Patient presents with   Breast Exam    Dull pain on LB only since Sunday. Area with pain was red.   Vaginal Bleeding    Spotting the last 1-2 weeks, mild cramping. First time ever.    HPI:      Ms. Dana Mccall is a 38 y.o. [id number] whose LMP was Patient's last menstrual period was 11/08/2023 (approximate)., presents today for BTB for the past 1-2 wks, unusual for pt. Bleeding is just with wiping. LMP was a few days longer than usual but normal flow/dysmen. Has had increased stress/wt change this month. Menses are usually monthly, lasting 4-5 days, mod flow, no BTB, mild dysmen. Pt is sexually active, no new partners, sometimes using condoms. Declines BC for now. S/p SAB 7/24. Hx of LEEP ~2010, neg paps since. Last pap at [facility] 1-2 yrs ago.  Pt also noticed dull pain LUQ LT breast 5 days ago. Area of tenderness was red, now bruised. Pt unsure of any trauma. No masses, no nipple d/c. FH breast cancer in her MGM.  Patient Active Problem List   Diagnosis Date Noted   SAB (spontaneous abortion) 05/20/2023    Past Surgical History:  Procedure Laterality Date   WISDOM TOOTH EXTRACTION      Family History  Problem Relation Age of Onset   Asthma Mother    Hypertension Mother    Dementia Father    Breast cancer Maternal Grandmother 21   Heart failure Maternal Grandfather     Social History   Socioeconomic History   Marital status: Single    Spouse name: Not on file   Number of children: Not on file   Years of education: Not on file   Highest education level: Not on file  Occupational History   Not on file  Tobacco Use   Smoking status: Some Days    Types: Cigars   Smokeless tobacco: Never  Vaping Use   Vaping status: Never Used  Substance and Sexual Activity   Alcohol use: Yes    Comment: ocassionally   Drug use: No   Sexual activity: Yes    Birth control/protection: Condom, None  Other Topics  Concern   Not on file  Social History Narrative   Not on file   Social Drivers of Health   Financial Resource Strain: Not on file  Food Insecurity: Not on file  Transportation Needs: Not on file  Physical Activity: Not on file  Stress: Not on file  Social Connections: Not on file  Intimate Partner Violence: Not on file    Outpatient Medications Prior to Visit  Medication Sig Dispense Refill   dicyclomine (BENTYL) 20 MG tablet Take 1 tablet (20 mg total) by mouth 3 (three) times daily as needed for spasms. 30 tablet 0   ibuprofen (ADVIL) 800 MG tablet Take 1 tablet (800 mg total) by mouth every 6 (six) hours as needed. 60 tablet 1   No facility-administered medications prior to visit.      ROS:  Review of Systems  Constitutional:  Negative for fever.  Gastrointestinal:  Negative for blood in stool, constipation, diarrhea, nausea and vomiting.  Genitourinary:  Positive for vaginal bleeding. Negative for dyspareunia, dysuria, flank pain, frequency, hematuria, urgency, vaginal discharge and vaginal pain.  Musculoskeletal:  Negative for back pain.  Skin:  Negative for rash.   BREAST: pain   OBJECTIVE:   Vitals:  BP 129/68   Pulse 99  Wt 155 lb (70.3 kg)   LMP 11/08/2023 (Approximate)   Breastfeeding No   BMI 30.27 kg/m   Physical Exam Vitals reviewed.  Constitutional:      Appearance: She is well-developed.  Pulmonary:     Effort: Pulmonary effort is normal.  Chest:  Breasts:    Breasts are symmetrical.     Right: No inverted nipple, mass, nipple discharge, skin change or tenderness.     Left: Tenderness present. No inverted nipple, mass, nipple discharge or skin change.    Genitourinary:    General: Normal vulva.     Pubic Area: No rash.      Labia:        Right: No rash, tenderness or lesion.        Left: No rash, tenderness or lesion.      Vagina: Normal. No vaginal discharge, erythema, tenderness or bleeding.     Cervix: Normal.     Uterus:  Normal. Not enlarged and not tender.      Adnexa: Right adnexa normal and left adnexa normal.       Right: No mass or tenderness.         Left: No mass or tenderness.       Comments: NO BLEEDING ON VAG EXAM Musculoskeletal:        General: Normal range of motion.     Cervical back: Normal range of motion.  Skin:    General: Skin is warm and dry.  Neurological:     General: No focal deficit present.     Mental Status: She is alert and oriented to person, place, and time.     Cranial Nerves: No cranial nerve deficit.  Psychiatric:        Mood and Affect: Mood normal.        Behavior: Behavior normal.        Thought Content: Thought content normal.        Judgment: Judgment normal.     Results: Results for orders placed or performed in visit on 11/28/23 (from the past 24 hours)  POCT urine pregnancy     Status: Normal   Collection Time: 11/28/23 12:12 PM  Result Value Ref Range   Preg Test, Ur Negative Negative     Assessment/Plan: Abnormal uterine bleeding (AUB) - Plan: Cytology - PAP, POCT urine pregnancy; with wiping only, neg exam today. Neg UPT, check pap/STDs. Reassurance. Follow up if sx persist next cycle for labs/GYN u/s.   Breast pain, left--most likely due to trauma given bruise. Leave alone to heal. F/u in 2 wks if sx persist for mammo/u/s of area.   Cervical cancer screening - Plan: Cytology - PAP  Screening for HPV (human papillomavirus) - Plan: Cytology - PAP  History of loop electrical excision procedure (LEEP) - Plan: Cytology - PAP    Return if symptoms worsen or fail to improve.  Victorino Fatzinger B. Giah Fickett, PA-C 11/28/2023 12:12 PM

## 2023-11-28 NOTE — Patient Instructions (Signed)
I value your feedback and you entrusting [place] with your care. If you get a  patient survey, I would appreciate you taking the time to let us know about your experience today. Thank you! ? ? ?

## 2023-12-03 ENCOUNTER — Encounter: Payer: Self-pay | Admitting: Obstetrics and Gynecology

## 2023-12-03 LAB — CYTOLOGY - PAP
Chlamydia: NEGATIVE
Comment: NEGATIVE
Comment: NEGATIVE
Comment: NORMAL
Diagnosis: NEGATIVE
High risk HPV: NEGATIVE
Neisseria Gonorrhea: NEGATIVE

## 2023-12-09 ENCOUNTER — Encounter: Payer: Self-pay | Admitting: Obstetrics and Gynecology

## 2024-01-10 ENCOUNTER — Emergency Department
Admission: EM | Admit: 2024-01-10 | Discharge: 2024-01-10 | Disposition: A | Discharge status: Home / Self Care | Attending: Emergency Medicine | Admitting: Emergency Medicine

## 2024-01-10 ENCOUNTER — Other Ambulatory Visit: Payer: Self-pay

## 2024-01-10 ENCOUNTER — Emergency Department

## 2024-01-10 DIAGNOSIS — R079 Chest pain, unspecified: Secondary | ICD-10-CM | POA: Insufficient documentation

## 2024-01-10 LAB — POC URINE PREG, ED
Preg Test, Ur: NEGATIVE
Preg Test, Ur: NEGATIVE

## 2024-01-10 LAB — BASIC METABOLIC PANEL
Anion gap: 11 (ref 5–15)
BUN: 17 mg/dL (ref 6–20)
CO2: 24 mmol/L (ref 22–32)
Calcium: 9.7 mg/dL (ref 8.9–10.3)
Chloride: 102 mmol/L (ref 98–111)
Creatinine, Ser: 0.84 mg/dL (ref 0.44–1.00)
GFR, Estimated: >60 mL/min (ref >60)
Glucose, Bld: 130 mg/dL — ABNORMAL HIGH (ref 70–99)
Potassium: 4.3 mmol/L (ref 3.5–5.1)
Sodium: 137 mmol/L (ref 135–145)

## 2024-01-10 LAB — CBC
HCT: 36.4 % (ref 36.0–46.0)
Hemoglobin: 12.4 g/dL (ref 12.0–15.0)
MCH: 34.8 pg — ABNORMAL HIGH (ref 26.0–34.0)
MCHC: 34.1 g/dL (ref 30.0–36.0)
MCV: 102.2 fL — ABNORMAL HIGH (ref 80.0–100.0)
Platelets: 303 10*3/uL (ref 150–400)
RBC: 3.56 MIL/uL — ABNORMAL LOW (ref 3.87–5.11)
RDW: 11.7 % (ref 11.5–15.5)
WBC: 7.2 10*3/uL (ref 4.0–10.5)
nRBC: 0 % (ref 0.0–0.2)

## 2024-01-10 LAB — TROPONIN I (HIGH SENSITIVITY): Troponin I (High Sensitivity): 3 ng/L (ref <18)

## 2024-01-10 MED ORDER — IBUPROFEN 600 MG PO TABS
600.0000 mg | ORAL_TABLET | Freq: Once | ORAL | Status: AC
  Administered 2024-01-10: 600 mg via ORAL
  Filled 2024-01-10: qty 1

## 2024-01-10 NOTE — ED Triage Notes (Signed)
 Pt here with c/o of CP that started at 0730 this morning, pt states no cardiac HX. Pt denies SOB or N/V. NAD noted.

## 2024-01-10 NOTE — ED Notes (Signed)
 Patient placed on cardiac monitor.

## 2024-01-10 NOTE — ED Provider Notes (Signed)
 [facility] Provider Note    Event Date/Time   First MD Initiated Contact with Patient 01/10/24 1222     (approximate)   History   Chief Complaint: Chest Pain   HPI  Dana Mccall is a 38 y.o. female with no significant past medical history who comes ED complaining of left-sided chest pain that started at 730 this morning.  Intermittent, sharp, no aggravating relieving factors.  Not pleuritic, not exertional.  Seems to be worse with moving her arm.  No shortness of breath diaphoresis or vomiting.  Nonradiating.  No dizziness or syncope.  Denies any recent travel trauma hospitalization or surgery.  No history of DVT or PE, denies leg swelling.          Physical Exam   Triage Vital Signs: ED Triage Vitals  Encounter Vitals Group     BP 01/10/24 1122 129/80     Systolic BP Percentile --      Diastolic BP Percentile --      Pulse Rate 01/10/24 1122 89     Resp 01/10/24 1122 17     Temp 01/10/24 1122 98 F (36.7 C)     Temp Source 01/10/24 1122 Oral     SpO2 01/10/24 1122 100 %     Weight 01/10/24 1119 154 lb 15.7 oz (70.3 kg)     Height 01/10/24 1119 5' (1.524 m)     Head Circumference --      Peak Flow --      Pain Score 01/10/24 1119 5     Pain Loc --      Pain Education --      Exclude from Growth Chart --     Most recent vital signs: Vitals:   01/10/24 1245 01/10/24 1300  BP:    Pulse:    Resp: 17 13  Temp:    SpO2:      General: Awake, no distress.  CV:  Good peripheral perfusion.  Regular rate rhythm.  Normal distal pulses. Resp:  Normal effort.  Clear to auscultation bilaterally Abd:  No distention.  Soft nontender Other:  There is some infrascapular chest wall tenderness which reproduces pain   ED Results / Procedures / Treatments   Labs (all labs ordered are listed, but only abnormal results are displayed) Labs Reviewed  BASIC METABOLIC PANEL - Abnormal; Notable for the following components:      Result Value    Glucose, Bld 130 (*)    All other components within normal limits  CBC - Abnormal; Notable for the following components:   RBC 3.56 (*)    MCV 102.2 (*)    MCH 34.8 (*)    All other components within normal limits  POC URINE PREG, ED  POC URINE PREG, ED  TROPONIN I (HIGH SENSITIVITY)     EKG Interpreted by me Sinus rhythm rate of 74.  Normal axis intervals QRS ST segments and T waves.   RADIOLOGY Chest x-ray interpreted by me, appears normal.  Radiology report reviewed   PROCEDURES:  Procedures   MEDICATIONS ORDERED IN ED: Medications  ibuprofen (ADVIL) tablet 600 mg (has no administration in time range)     IMPRESSION / MDM / ASSESSMENT AND PLAN / ED COURSE  I reviewed the triage vital signs and the nursing notes.  DDx: Anxiety, GERD, chest wall muscle spasm, NSTEMI  Patient's presentation is most consistent with acute presentation with potential threat to life or bodily function.  P/w nonspecific chest pain. PERC  negative.  Considering the patient's symptoms, medical history, and physical examination today, I have low suspicion for ACS, PE, TAD, pneumothorax, carditis, mediastinitis, pneumonia, CHF, or sepsis. Rec nsaids, f/u PCP.        FINAL CLINICAL IMPRESSION(S) / ED DIAGNOSES   Final diagnoses:  Nonspecific chest pain     Rx / DC Orders   ED Discharge Orders     None        Note:  This document was prepared using Dragon voice recognition software and may include unintentional dictation errors.   Sharman Cheek, MD 01/10/24 [phone]

## 2024-01-10 NOTE — ED Notes (Signed)
 Pt provided discharge instructions and prescription information. Pt was given the opportunity to ask questions and questions were answered.
